# Patient Record
Sex: Female | Born: 1960 | Race: Black or African American | Hispanic: No | Marital: Married | Smoking: Never smoker
Health system: Southern US, Community
[De-identification: ages and names within clinical notes are randomized; demographics above are authoritative.]

## PROBLEM LIST (undated history)

## (undated) DIAGNOSIS — N3281 Overactive bladder: Secondary | ICD-10-CM

## (undated) DIAGNOSIS — E663 Overweight: Secondary | ICD-10-CM

## (undated) DIAGNOSIS — E119 Type 2 diabetes mellitus without complications: Secondary | ICD-10-CM

## (undated) HISTORY — DX: Type 2 diabetes mellitus without complications: E11.9

## (undated) HISTORY — PX: MOUTH SURGERY: SHX715

## (undated) HISTORY — DX: Overweight: E66.3

## (undated) HISTORY — PX: ROTATOR CUFF REPAIR: SHX139

## (undated) HISTORY — DX: Overactive bladder: N32.81

---

## 1997-11-25 ENCOUNTER — Inpatient Hospital Stay (HOSPITAL_COMMUNITY): Admission: AD | Admit: 1997-11-25 | Discharge: 1997-11-25 | Payer: Self-pay | Admitting: Obstetrics and Gynecology

## 1997-12-02 ENCOUNTER — Encounter (HOSPITAL_COMMUNITY): Admission: RE | Admit: 1997-12-02 | Discharge: 1997-12-26 | Payer: Self-pay | Admitting: Obstetrics and Gynecology

## 1997-12-23 ENCOUNTER — Inpatient Hospital Stay (HOSPITAL_COMMUNITY): Admission: AD | Admit: 1997-12-23 | Discharge: 1997-12-26 | Payer: Self-pay | Admitting: Gynecology

## 1998-02-03 ENCOUNTER — Other Ambulatory Visit: Admission: RE | Admit: 1998-02-03 | Discharge: 1998-02-03 | Payer: Self-pay | Admitting: Obstetrics and Gynecology

## 1998-12-21 ENCOUNTER — Emergency Department (HOSPITAL_COMMUNITY): Admission: EM | Admit: 1998-12-21 | Discharge: 1998-12-21 | Payer: Self-pay | Admitting: Emergency Medicine

## 1999-09-17 HISTORY — PX: TUBAL LIGATION: SHX77

## 2000-01-23 ENCOUNTER — Other Ambulatory Visit: Admission: RE | Admit: 2000-01-23 | Discharge: 2000-01-23 | Payer: Self-pay | Admitting: Gynecology

## 2000-06-03 ENCOUNTER — Inpatient Hospital Stay (HOSPITAL_COMMUNITY): Admission: AD | Admit: 2000-06-03 | Discharge: 2000-06-03 | Payer: Self-pay | Admitting: Gynecology

## 2000-07-31 ENCOUNTER — Inpatient Hospital Stay (HOSPITAL_COMMUNITY): Admission: AD | Admit: 2000-07-31 | Discharge: 2000-08-03 | Payer: Self-pay | Admitting: Gynecology

## 2000-07-31 ENCOUNTER — Encounter (INDEPENDENT_AMBULATORY_CARE_PROVIDER_SITE_OTHER): Payer: Self-pay | Admitting: Specialist

## 2000-09-23 ENCOUNTER — Other Ambulatory Visit: Admission: RE | Admit: 2000-09-23 | Discharge: 2000-09-23 | Payer: Self-pay | Admitting: Gynecology

## 2002-06-03 ENCOUNTER — Other Ambulatory Visit: Admission: RE | Admit: 2002-06-03 | Discharge: 2002-06-03 | Payer: Self-pay | Admitting: Gynecology

## 2002-07-15 ENCOUNTER — Encounter: Admission: RE | Admit: 2002-07-15 | Discharge: 2002-10-13 | Payer: Self-pay | Admitting: Gynecology

## 2004-05-14 ENCOUNTER — Other Ambulatory Visit: Admission: RE | Admit: 2004-05-14 | Discharge: 2004-05-14 | Payer: Self-pay | Admitting: Gynecology

## 2004-08-02 ENCOUNTER — Encounter: Admission: RE | Admit: 2004-08-02 | Discharge: 2004-08-02 | Payer: Self-pay | Admitting: Family Medicine

## 2005-02-25 ENCOUNTER — Encounter: Admission: RE | Admit: 2005-02-25 | Discharge: 2005-02-25 | Payer: Self-pay | Admitting: Family Medicine

## 2005-12-31 ENCOUNTER — Other Ambulatory Visit: Admission: RE | Admit: 2005-12-31 | Discharge: 2005-12-31 | Payer: Self-pay | Admitting: Gynecology

## 2007-12-18 ENCOUNTER — Other Ambulatory Visit: Admission: RE | Admit: 2007-12-18 | Discharge: 2007-12-18 | Payer: Self-pay | Admitting: Gynecology

## 2009-08-30 ENCOUNTER — Other Ambulatory Visit: Admission: RE | Admit: 2009-08-30 | Discharge: 2009-08-30 | Payer: Self-pay | Admitting: Gynecology

## 2009-08-30 ENCOUNTER — Ambulatory Visit: Payer: Self-pay | Admitting: Gynecology

## 2010-02-22 ENCOUNTER — Ambulatory Visit: Payer: Self-pay | Admitting: Gynecology

## 2011-02-01 NOTE — H&P (Signed)
[facility]  Patient:    Becky Decker, Becky Decker                    MRN: [id number] Adm. Date:  07/31/00 Attending:  Gaetano Hawthorne. Lily Peer, M.D.                         History and Physical  CHIEF COMPLAINT:              1. Suspected fetal macrosomia.                               2. Previous cesarean section, requesting                                  elective repeat.                               3. Request for elective permanent sterilization.  HISTORY:                      The patient is a 50 year old gravida 2, para 1, who was seen in the office at her last prenatal visit on November 8th, with a followup ultrasound due to the fact that during her pregnancy and previous ultrasounds that indicated that this developing fetus was in the 97th percentile.  She did have a cesarean section in April of 1999 at 40 weeks, an 8-pound 3-ounce baby, secondary to failure to progress after two hours of labor.  Now, at [redacted] weeks gestation, this developing fetus is greater than the 97th percentile and weighs more than her previous baby; this baby weighs currently 8 pounds 10 ounces, with normal amniotic fluid index in the 58th percentile.  Head circumference was equivalent to 40 weeks size.  With these findings, patient and her husband decided not to go through a trial of labor but to go with a repeat cesarean section; they had also requested for an elective bilateral tubal sterilization procedure.  The risks, benefits, pros and cons of these were discussed with the patient in detail in the office. Due to the fact that her father has a strong history of diabetes, a hemoglobin A1c was done earlier in her pregnancy which was found to be normal, and her diabetes screen was elevated at 158; subsequently followed with a three-hour GTT which was normal.  Also, she was tested for sickle cell, which was negative, but her daughter is a sickle cell carrier.  Her husband was  tested and he is a carrier.  We had obtained fluid at the time of the amniocentesis to be submitted also for testing for sickle cell but the laboratory had problems and this was not tested and the family decided not to offer a repeat amniocentesis and just to test the newborn for carrier status.  She had a urinary tract infection during her pregnancy and has had some bouts of nausea also and she does have a fibroid uterus; otherwise, she has done well.  PHYSICAL EXAMINATION  VITAL SIGNS:                  Blood pressure 110/72.  Weight was 228 pounds. Urine had 1+ protein and a urine culture is pending at time of this dictation.  HEENT:  Unremarkable.  NECK:                         Supple.  Trachea midline.  No carotid bruits. No thyromegaly.  LUNGS:                        Clear to auscultation without rhonchi or wheezes.  HEART:                        Regular rate and rhythm.  No murmurs or gallops.  BREASTS:                      Examination was done during the first trimester and reported to be normal.  ABDOMEN:                      Gravid uterus with a fundal height approximately 42.5 cm.  Positive fetal heart tones.  PELVIC:                       Cervix was long, closed and posterior.  EXTREMITIES:                  DTRs 1+.  Negative clonus.  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  Her VDRL, hepatitis B surface antigen and HIV were negative.  Rubella titer with evidence of immunity.  Her alpha-fetoprotein was normal.  Diabetes screen:  As described above.  GBS culture was negative and her Pap smear was also normal.  ASSESSMENT:                   [age]-year-old gravida 2, para 1 will be 39 weeks at time of her planned repeat cesarean section, with suspected fetal macrosomia and previous history of failure to progress during  the second stage of labor at 40 weeks and was carrying an 8-pound 3-ounce baby at that time. She is only 38 weeks now.  This baby is in the 97th percentile, with the head circumference equivalent to [redacted] weeks gestation and weighing now 8 pounds 10 ounces.  Patient has elected to proceed with an elective repeat cesarean section and also for elective permanent sterilization.  Risks, benefits, pros and cons and failure rates of sterilization procedure were discussed as well. She did receive RhoGAM at the time of her amniocentesis and at 28 weeks and she was found to have anti-Lewis antibodies, which was discussed with her that this does not cause erythroblastosis, and will proceed then with cesarean section.  PLAN:                         Patient is scheduled for repeat lower uterine segment transverse cesarean section along with bilateral tubal sterilization procedure on Thursday, November 15th, at 7:30 a.m. at [facility]; please have history and physical available. DD:  07/25/00 TD:  07/25/00 Job: [id number]

## 2011-02-01 NOTE — Op Note (Signed)
[facility]  Patient:    Becky Decker, Becky Decker                    MRN: [id number] Adm. Date:  [id number] Attending:  Tonye Royalty                           Operative Report  PREOPERATIVE DIAGNOSES:       1. Term intrauterine pregnancy at 52 weeks                                  estimated gestational age.                               2. Suspected fetal macrosomia.                               3. Requests repeat cesarean section.                               4. Requests elective permanent sterilization.  POSTOPERATIVE DIAGNOSES:      1. Term intrauterine pregnancy at 52 weeks                                  estimated gestational age.                               2. Suspected fetal macrosomia.                               3. Requests repeat cesarean section.                               4. Requests elective permanent sterilization.  PROCEDURE PERFORMED:          Repeat lower uterine segment transverse cesarean section and bilateral tubal sterilization procedure, Pomeroy technique.  SURGEON:                      Juan H. Lily Peer, M.D.  FIRST ASSISTANT:              Timothy P. Fontaine, M.D.  ANESTHESIA:                   Spinal.  INDICATIONS:                  A 50 year old gravida 2, para 1 with previous cesarean section secondary to failure to progress in labor and an LGA baby. Suspected macrosomia this pregnancy.  The patient has elected for repeat cesarean section as well as elective permanent sterilization.  FINDINGS:                     Viable female infant with Apgars of 9 and 9 with a weight of 8 pounds and 8 ounces.  Clear amniotic fluid.  Normal maternal pelvic anatomy.  Nuchal cord x 1.  DESCRIPTION OF PROCEDURE:     After the patient was adequately  ______ , she underwent successful spinal placement.  She was prepped and draped in the usual sterile fashion.  A Pfannenstiel skin incision was made adjacent to the previous  Pfannenstiel scar.  A Foley catheter prior to this had been inserted and left to monitor urinary output.  The incision was carried down through the skin and subcutaneous tissue to the rectus fascia, whereby a midline nick was made.  The fascia was incised in a transverse fashion for additional exposure. A small portion of the abdominis rectus muscle was incised to allow adequate room due to a significant amount of scarring from previous cesarean section, limiting somewhat the exposure.  After this was accomplished and the peritoneum was entered cautiously, a bladder flap was established.  The lower uterine segment was incised in a transverse fashion.  Clear amniotic fluid was present.  The newborns head was delivered.  The nasopharyngeal area was well suctioned.  The nuchal cord was manually reduced and the newborn was delivered.  The nasopharyngeal area was bulb suctioned.  He gave immediate cry.  The cord was double clamped and excised.  The infant was passed off of operative field after being shown to the patient.  It was passed off to the pediatricians who were in attendance and gave the above mentioned parameters. After cord blood was obtained, the placenta was delivered from the intrauterine cavity and submitted for histologic evaluation.  The uterus was then exteriorized.  The interior uterine cavity was sucked clear of remaining products of conception.  A Pitocin drop was started.  The patient did receive 1 g of Cefotan due to the fact that she was being treated at the same time for a urinary tract infection.  The lower uterine segment was closed with a single layer closure with 0 Vicryl suture in a locking stitch manner.  After this, attention was paid to the proximal 1/3 portion of the left fallopian tube.  A 2 cm segment was suture ligated with 3-0 Vicryl suture x 2 and the 2 cm segment was excised and passed off of the operative field.  The remaining stumps were Bovie cauterized.   A similar procedure was carried out on the contralateral side.  The uterus was placed back into the abdominal cavity. After copious irrigation of the pelvic cavity and ascertaining adequate hemostasis, the fascia was closed with a running stitch of 0 Vicryl suture. Of note, the visceral peritoneum was now approximated.  The subcutaneous bleeders were Bovie cauterized.  The skin was reapproximated with skin clips, followed by placement of Xeroform gauze and 4 x 4 dressing.  The patient was transferred to the recovery room with stable vital signs.  Blood loss for the procedure was 600 cc.  Urine output was 150 cc and clear.  IV fluids were 3500 cc of lactated Ringers. DD:  07/31/00 TD:  07/31/00 Job: [id number]

## 2011-02-01 NOTE — Discharge Summary (Signed)
[facility]  Patient:    Becky Decker, Becky Decker                    MRN: [id number] Adm. Date:  [id number] Disc. Date: [id number] Attending:  Tonye Royalty Dictator:   Antony Contras, [facility]                           Discharge Summary  DISCHARGE DIAGNOSES:          Intrauterine pregnancy at term, following prenatal risk factors:  History of previous cesarean section, anti ______ A antibody, advanced maternal age, fibroid uterus, husband positive for sickle cell trait, RhoGAM candidate.  PROCEDURE:                    Low cervical transverse cesarean section with tubal sterilization.  HISTORY OF PRESENT ILLNESS:   Patient is a 50 year old gravida 2, para 1-0-0-1 with an EDC of August 07, 2000 per ultrasound.  Patient has the following prenatal risk factors:  History of previous cesarean section, anti ______ A antibody, advanced maternal age, gestational diabetes with her last pregnancy, RhoGAM candidate, fibroid uterus, husband positive for sickle cell trait.  LABORATORIES:                 Blood type O-.  Antibody screen positive. Sickle cell negative.  RPR, HBSAG, HIV nonreactive.  Rubella immune.  GBS negative.  MSAFP normal.  Normal amniocentesis.  HOSPITAL COURSE:              Patient was admitted for elective repeat cesarean section, also fetal macrosomia was suspected.  Also, she wished permanent tubal sterilization.  This was performed by Dr. Lily Peer under spinal anesthesia.  Patient was delivered of an Apgar 9/9 female infant weighing 8 pounds 8 ounces.  Postoperative course was complicated by increased flatus. Otherwise she remained afebrile.  Had no difficulty voiding.  CBC:  Hematocrit 28, hemoglobin 10, WBC 13.7, platelets 211.  Patient did also receive RhoGAM post delivery.  She was able to be discharged on her third postoperative day in satisfactory condition.  DISPOSITION:                  Follow up in six weeks.  Continue with  prenatal vitamins and iron. DD:  08/29/00 TD:  08/29/00 Job: [id number]

## 2011-04-22 ENCOUNTER — Encounter: Payer: Self-pay | Admitting: Anesthesiology

## 2011-04-26 ENCOUNTER — Encounter: Payer: Self-pay | Admitting: Gynecology

## 2011-05-07 ENCOUNTER — Encounter: Payer: Self-pay | Admitting: Gynecology

## 2011-05-14 ENCOUNTER — Encounter: Payer: Self-pay | Admitting: Gynecology

## 2011-05-14 ENCOUNTER — Ambulatory Visit (INDEPENDENT_AMBULATORY_CARE_PROVIDER_SITE_OTHER): Payer: BC Managed Care – PPO | Admitting: Gynecology

## 2011-05-14 ENCOUNTER — Other Ambulatory Visit (HOSPITAL_COMMUNITY)
Admission: RE | Admit: 2011-05-14 | Discharge: 2011-05-14 | Disposition: A | Discharge status: Home / Self Care | Payer: BC Managed Care – PPO | Source: Ambulatory Visit | Attending: Gynecology | Admitting: Gynecology

## 2011-05-14 DIAGNOSIS — Z8632 Personal history of gestational diabetes: Secondary | ICD-10-CM

## 2011-05-14 DIAGNOSIS — R823 Hemoglobinuria: Secondary | ICD-10-CM

## 2011-05-14 DIAGNOSIS — Z01419 Encounter for gynecological examination (general) (routine) without abnormal findings: Secondary | ICD-10-CM

## 2011-05-14 DIAGNOSIS — Z13220 Encounter for screening for lipoid disorders: Secondary | ICD-10-CM

## 2011-05-14 DIAGNOSIS — Z1211 Encounter for screening for malignant neoplasm of colon: Secondary | ICD-10-CM

## 2011-05-14 DIAGNOSIS — R635 Abnormal weight gain: Secondary | ICD-10-CM | POA: Insufficient documentation

## 2011-05-14 DIAGNOSIS — N3941 Urge incontinence: Secondary | ICD-10-CM

## 2011-05-14 LAB — POC HEMOCCULT BLD/STL (OFFICE/1-CARD/DIAGNOSTIC): Fecal Occult Blood, POC: NEGATIVE

## 2011-05-14 NOTE — Progress Notes (Signed)
Becky Decker March 27, 1961 [id number]   History:    50 y.o. gravida 2 para 2 who has not been seen in the office in 2 years review of her records indicated she was weighing 215 pounds up to 251 pounds. She was seen in June of last year with complaints of urgency incontinence had a Q-tip angle tests less than 90 and on Valsalva maneuver in the supine and erect position she denied leaking urine and it was recommended she followup with the urodynamic evaluation which she has not done so. She states immediately after she drinks something she has urinary frequency and she does have nocturia whereby she gets up at at least 5 times a night to urinate regardless which she drinks during the day. She is fasting today so we'll be overdue her fasting lipid profile today and screen her for diabetes as well. Back in June 2011 her LDL was elevated 173. She frequently does result has examination but her last mammogram was several years ago. She has not had a screening colonoscopy or baseline bone density study yet. In June of 2011 she was noted to have an elevated FSH indicative of her being menopausal state. Patient's not complaining of any vasomotor symptoms.  Past medical history,surgical history, family history and social history were all reviewed and documented in the EPIC chart. ROS:  Was performed and pertinent positives and negatives are included in the history.  Exam: chaperone present Filed Vitals:   05/14/11 1402  BP: 132/94   @WEIGHT @ Body mass index is 45.54 kg/(m^2).  General appearance : Well developed well nourished female. Skin grossly normal/anterior left chest wall lipoma HEENT: Neck supple, trachea midline Lungs: Clear to auscultation, no rhonchi or wheezes Heart: Regular rate and rhythm, no murmurs or gallops Breast:Examined in sitting and supine position were symmetrical in appearance, no palpable masses, to skin retraction, no nipple inversion, no nipple discharge and no axillary or  supraclavicular lymphadenopathy Abdomen: no palpable masses or tenderness Pelvic  Ext/BUS/vagina  normal   Cervix  normal   Uterus  anteverted, normal size, shape and contour, midline and mobile nontender   Adnexa  Without masses or tenderness  Anus and perineum  normal   Rectovaginal  normal sphincter tone without palpated masses or tenderness             Hemoccult obtained results pending at time of this dictation     Assessment/Plan:  50 y.o. female for annual exam with complaints of her urgency incontinence. Patient is morbidly obese. History of elevated LDL last year. Patient currently in fasting state for this reason we'll check a fasting blood sugar along with a fasting lipid profile CBC urinalysis and Pap smear and a TSH as a result of her weight gain. Patient will be referred to the gastroenterologists for screening colonoscopy. Fecal call blood testing was done today results pending at time of this dictation patient with schedule urodynamic evaluation the office within the next several weeks. She will also need to schedule her mammogram as well. We discussed importance of appropriate nutrition exercise as well as intake of calcium and vitamin D for osteoporosis prevention.    Ok Edwards MD, 2:44 PM 05/14/2011

## 2011-05-15 ENCOUNTER — Telehealth: Payer: Self-pay | Admitting: *Deleted

## 2011-05-15 DIAGNOSIS — R7301 Impaired fasting glucose: Secondary | ICD-10-CM

## 2011-05-15 NOTE — Telephone Encounter (Signed)
I CALLED PTS HUSBAND TO GIVE INSURANCE BENEFITS FOR URODYNAMICS AND TO SET UP APPT. IT IS SCHEDULED FOR 05/29/11 AT 9AM. INSTRUCTION SHEET MAILED. KW

## 2011-05-15 NOTE — Telephone Encounter (Signed)
Pt informed with the below note, order in computer for internal medicine doctor.

## 2011-05-15 NOTE — Telephone Encounter (Signed)
Message copied by Aura Camps on Wed May 15, 2011  8:36 AM ------      Message from: Ok Edwards      Created: Tue May 14, 2011  3:52 PM       Victorino Dike, please call patient's and tell her that her blood sugar was very high indicative of possibly being diabetic. This would explain her urinary frequency. Fasting blood sugar should be less than 126 and her values today was 242. Her total cholesterol was elevated to 10 and her LDL was elevated at 132 as well her HDL and triglyceride was normal. Patellar we'll need to counsel her urodynamic evaluation. It seems her symptoms or urinary frequency is attributed to her underlying diabetes that has just been diagnosed. She will need to be on tighter control and placed on appropriate medication. I would like to refer her to Dr. Margaretmary Bayley was internal medicine specialist for further evaluation and treatment. Please check with Dr. Ophelia Charter office to see if there on electronic medical records with epic so they can obtain my office note. If not we'll need to for them a copy of my last office note.

## 2011-05-29 ENCOUNTER — Ambulatory Visit (INDEPENDENT_AMBULATORY_CARE_PROVIDER_SITE_OTHER): Payer: BC Managed Care – PPO | Admitting: *Deleted

## 2011-05-29 DIAGNOSIS — N393 Stress incontinence (female) (male): Secondary | ICD-10-CM

## 2011-05-29 DIAGNOSIS — N3941 Urge incontinence: Secondary | ICD-10-CM

## 2011-06-06 ENCOUNTER — Other Ambulatory Visit: Payer: Self-pay | Admitting: *Deleted

## 2011-06-06 DIAGNOSIS — R32 Unspecified urinary incontinence: Secondary | ICD-10-CM

## 2011-06-06 DIAGNOSIS — N393 Stress incontinence (female) (male): Secondary | ICD-10-CM

## 2011-06-06 DIAGNOSIS — N3941 Urge incontinence: Secondary | ICD-10-CM

## 2011-06-11 ENCOUNTER — Encounter: Payer: Self-pay | Admitting: Gynecology

## 2011-06-11 ENCOUNTER — Ambulatory Visit (INDEPENDENT_AMBULATORY_CARE_PROVIDER_SITE_OTHER): Payer: BC Managed Care – PPO | Admitting: Gynecology

## 2011-06-11 VITALS — BP 128/76

## 2011-06-11 DIAGNOSIS — IMO0001 Reserved for inherently not codable concepts without codable children: Secondary | ICD-10-CM

## 2011-06-11 DIAGNOSIS — E669 Obesity, unspecified: Secondary | ICD-10-CM

## 2011-06-11 DIAGNOSIS — R32 Unspecified urinary incontinence: Secondary | ICD-10-CM

## 2011-06-11 DIAGNOSIS — E119 Type 2 diabetes mellitus without complications: Secondary | ICD-10-CM

## 2011-06-11 DIAGNOSIS — R350 Frequency of micturition: Secondary | ICD-10-CM

## 2011-06-11 MED ORDER — TOLTERODINE TARTRATE ER 4 MG PO CP24: 4.0000 mg | ORAL_CAPSULE | Freq: Every day | ORAL | Status: DC

## 2011-06-11 NOTE — Progress Notes (Signed)
Patient presented to the office today for consultation due to the fact she underwent a urodynamic evaluation in the office on 05/29/2011 as a result of her complaints of urinary frequency and urge incontinence. Prior to that lab work have been drawn at time of her annual exam and her fasting blood sugar was found to be elevated at 242. Her CBC was normal her total cholesterol was slightly elevated to 10, HDL normal at 59, triglycerides normal at 97, LDL was slightly elevated at 132, and her cholesterol/HDL ratio was normal at 3.6. Her Pap smear was normal as well. Colonoscopy and bone density study has not been done yet. Patient is postmenopausal on no hormone replacement therapy.  Patient was referred to the internist and Dr. Margaretmary Bayley who had seen her and start her treating her for the newly diagnosed type 2 diabetes and has placed her on an oral hypoglycemic agent. (Patient does not recall the name).  Urodynamic evaluation: Complex uroflowmetry was normal and demonstrated no evidence of obstructive uropathy. Her post void residual was normal at 0 cc. 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 The patient's cystometric bladder capacity was limited (less than 4 cc). The patient sensory vines were higher than expected. Maximum urethral closure pressure (greater than 30 cm of water) were within a normal range and failed to demonstrate intrinsic sphincter deficiency. 3 channel CMG with flow was within normal limits given the testing situation. (Peak flow 20-30 mL/second with PVR of 25% or less of voided volume)  Patient with apparent next incontinence (urgent stress). Also would newly diagnosed diabetes. Patient with low bladder capacity and  high sensitivity. Patient also with low leak point pressures. Although her Q-tip test was less than 30 there was no evidence of cystocele or rectocele the case had been discussed the Dr. Patsi Sears urologist for further consideration. He had recommended passes patient may be a candidate for physical therapy and for possible Botox. I had a lengthy discussion with the patient today and although the CMG did not demonstrate detrusor dyssynergia but based on her history of frequency and nocturia she would like to give a try an anticholinergic agent for which will tried Detrol LA 4 mg daily. The risks benefits and pros and cons were discussed. Patient has no history of glaucoma. She will return back to the office in 4-6 months to reassess. If her symptoms worsen she states that she would go see the urologist for further evaluation and intervention.

## 2011-06-12 ENCOUNTER — Encounter: Payer: Self-pay | Admitting: *Deleted

## 2012-05-27 ENCOUNTER — Other Ambulatory Visit: Payer: Self-pay | Admitting: Gynecology

## 2012-05-27 DIAGNOSIS — Z1231 Encounter for screening mammogram for malignant neoplasm of breast: Secondary | ICD-10-CM

## 2012-06-05 ENCOUNTER — Ambulatory Visit (INDEPENDENT_AMBULATORY_CARE_PROVIDER_SITE_OTHER): Payer: BC Managed Care – PPO | Admitting: Gynecology

## 2012-06-05 ENCOUNTER — Ambulatory Visit
Admission: RE | Admit: 2012-06-05 | Discharge: 2012-06-05 | Disposition: A | Discharge status: Home / Self Care | Payer: BC Managed Care – PPO | Source: Ambulatory Visit | Attending: Gynecology | Admitting: Gynecology

## 2012-06-05 ENCOUNTER — Encounter: Payer: Self-pay | Admitting: Gynecology

## 2012-06-05 VITALS — BP 128/80 | Ht 63.0 in | Wt 244.0 lb

## 2012-06-05 DIAGNOSIS — Z01419 Encounter for gynecological examination (general) (routine) without abnormal findings: Secondary | ICD-10-CM

## 2012-06-05 DIAGNOSIS — IMO0001 Reserved for inherently not codable concepts without codable children: Secondary | ICD-10-CM

## 2012-06-05 DIAGNOSIS — Z23 Encounter for immunization: Secondary | ICD-10-CM

## 2012-06-05 DIAGNOSIS — Z1231 Encounter for screening mammogram for malignant neoplasm of breast: Secondary | ICD-10-CM

## 2012-06-05 DIAGNOSIS — E663 Overweight: Secondary | ICD-10-CM

## 2012-06-05 DIAGNOSIS — N3281 Overactive bladder: Secondary | ICD-10-CM

## 2012-06-05 DIAGNOSIS — R350 Frequency of micturition: Secondary | ICD-10-CM

## 2012-06-05 DIAGNOSIS — N318 Other neuromuscular dysfunction of bladder: Secondary | ICD-10-CM

## 2012-06-05 DIAGNOSIS — E119 Type 2 diabetes mellitus without complications: Secondary | ICD-10-CM | POA: Insufficient documentation

## 2012-06-05 LAB — CBC WITH DIFFERENTIAL/PLATELET
Basophils Absolute: 0 10*3/uL (ref 0.0–0.1)
Basophils Relative: 0 % (ref 0–1)
Eosinophils Absolute: 0.1 10*3/uL (ref 0.0–0.7)
Eosinophils Relative: 2 % (ref 0–5)
HCT: 45.5 % (ref 36.0–46.0)
Hemoglobin: 15.1 g/dL — ABNORMAL HIGH (ref 12.0–15.0)
Lymphocytes Relative: 27 % (ref 12–46)
Lymphs Abs: 1.6 10*3/uL (ref 0.7–4.0)
MCH: 29.9 pg (ref 26.0–34.0)
MCHC: 33.2 g/dL (ref 30.0–36.0)
MCV: 90.1 fL (ref 78.0–100.0)
Monocytes Absolute: 0.3 10*3/uL (ref 0.1–1.0)
Monocytes Relative: 4 % (ref 3–12)
Neutro Abs: 4 10*3/uL (ref 1.7–7.7)
Neutrophils Relative %: 67 % (ref 43–77)
Platelets: 247 10*3/uL (ref 150–400)
RBC: 5.05 MIL/uL (ref 3.87–5.11)
RDW: 12.9 % (ref 11.5–15.5)
WBC: 6 10*3/uL (ref 4.0–10.5)

## 2012-06-05 LAB — COMPREHENSIVE METABOLIC PANEL
ALT: 33 U/L (ref 0–35)
AST: 19 U/L (ref 0–37)
Albumin: 4.3 g/dL (ref 3.5–5.2)
Alkaline Phosphatase: 122 U/L — ABNORMAL HIGH (ref 39–117)
BUN: 12 mg/dL (ref 6–23)
CO2: 25 mEq/L (ref 19–32)
Calcium: 9.3 mg/dL (ref 8.4–10.5)
Chloride: 101 mEq/L (ref 96–112)
Creat: 0.71 mg/dL (ref 0.50–1.10)
Glucose, Bld: 285 mg/dL — ABNORMAL HIGH (ref 70–99)
Potassium: 4 mEq/L (ref 3.5–5.3)
Sodium: 136 mEq/L (ref 135–145)
Total Bilirubin: 0.8 mg/dL (ref 0.3–1.2)
Total Protein: 7.6 g/dL (ref 6.0–8.3)

## 2012-06-05 LAB — LIPID PANEL
Cholesterol: 244 mg/dL — ABNORMAL HIGH (ref 0–200)
HDL: 55 mg/dL (ref >39)
LDL Cholesterol: 167 mg/dL — ABNORMAL HIGH (ref 0–99)
Total CHOL/HDL Ratio: 4.4 Ratio
Triglycerides: 108 mg/dL (ref <150)
VLDL: 22 mg/dL (ref 0–40)

## 2012-06-05 LAB — TSH: TSH: 0.704 u[IU]/mL (ref 0.350–4.500)

## 2012-06-05 LAB — HEMOGLOBIN A1C
Hgb A1c MFr Bld: 13.8 % — ABNORMAL HIGH (ref <5.7)
Mean Plasma Glucose: 349 mg/dL — ABNORMAL HIGH (ref <117)

## 2012-06-05 MED ORDER — TOLTERODINE TARTRATE ER 4 MG PO CP24: 4.0000 mg | ORAL_CAPSULE | Freq: Every day | ORAL | Status: DC

## 2012-06-05 NOTE — Progress Notes (Signed)
Becky Decker February 23, 1961 [id number]   History:    51 y.o.  for annual gyn exam who was seen last year and was diagnosed with diabetes and was referred to Dr. Margaretmary Bayley. She was also noted to have borderline hypercholesterolemia. Dr. Chestine Spore had placed her on oral hypoglycemic agents (see medication list) but patient has been poor compliant and stopped  the medication last August. She is overweight but lost 8 pounds since last year. She has been adamant and has not had a colonoscopy yet. She was diagnosed as being in the menopause in 2011 with elevated FSH. She has had no vasomotor symptoms and is currently on no hormone replacement therapy. No prior bone density study. Her Pap smear was normal in 2012. Last year her Hemoccult test was negative. Last year her TSH level was normal. Patient had complained of urinary frequency and mild incontinence and had a urodynamic evaluation which demonstrated that she had low bladder capacity along with increased sensitivity and was to be followed by the urologist and she did not. Her urethral Q-tip angle was less than 30. It appears that her urinary frequency has been attributed to her increase fluid intake as well as her diabetes. She had been started on Detrol LA 4 mg daily last year which helped with her urinary frequency. Patient several years ago had a tubal ligation. Her last mammogram was in 2010 which was normal. She had one done this morning result pending.  Past medical history,surgical history, family history and social history were all reviewed and documented in the EPIC chart.  Gynecologic History Patient's last menstrual period was 03/13/2012. Contraception: tubal ligation Last Pap: 2012. Results were: normal Last mammogram: 2013. Results were: Results pending  Obstetric History OB History    Grav Para Term Preterm Abortions TAB SAB Ect Mult Living   2 2 2       2      # Outc Date GA Lbr Len/2nd Wgt Sex Del Anes PTL Lv   1 TRM     F CS  No  Yes   2 TRM     M CS  No Yes       ROS: A ROS was performed and pertinent positives and negatives are included in the history.  GENERAL: No fevers or chills. HEENT: No change in vision, no earache, sore throat or sinus congestion. NECK: No pain or stiffness. CARDIOVASCULAR: No chest pain or pressure. No palpitations. PULMONARY: No shortness of breath, cough or wheeze. GASTROINTESTINAL: No abdominal pain, nausea, vomiting or diarrhea, melena or bright red blood per rectum. GENITOURINARY: No urinary frequency, urgency, hesitancy or dysuria. MUSCULOSKELETAL: No joint or muscle pain, no back pain, no recent trauma. DERMATOLOGIC: No rash, no itching, no lesions. ENDOCRINE: No polyuria, polydipsia, no heat or cold intolerance. No recent change in weight. HEMATOLOGICAL: No anemia or easy bruising or bleeding. NEUROLOGIC: No headache, seizures, numbness, tingling or weakness. PSYCHIATRIC: No depression, no loss of interest in normal activity or change in sleep pattern.     Exam: chaperone present  BP 128/80  Ht 5\' 3"  (1.6 m)  Wt 244 lb (110.678 kg)  BMI 43.22 kg/m2  LMP 03/13/2012  Body mass index is 43.22 kg/(m^2).  General appearance : Well developed well nourished female. No acute distress HEENT: Neck supple, trachea midline, no carotid bruits, no thyroidmegaly Lungs: Clear to auscultation, no rhonchi or wheezes, or rib retractions  Heart: Regular rate and rhythm, no murmurs or gallops Breast:Examined in sitting and supine position were  symmetrical in appearance, no palpable masses or tenderness,  no skin retraction, no nipple inversion, no nipple discharge, no skin discoloration, no axillary or supraclavicular lymphadenopathy Abdomen: no palpable masses or tenderness, no rebound or guarding Extremities: no edema or skin discoloration or tenderness  Pelvic:  Bartholin, Urethra, Skene Glands: Within normal limits             Vagina: No gross lesions or discharge  Cervix: No gross lesions or  discharge  Uterus  anteverted, normal size, shape and consistency, non-tender and mobile  Adnexa  Without masses or tenderness  Anus and perineum  normal   Rectovaginal  normal sphincter tone without palpated masses or tenderness             Hemoccult cards will be provided     Assessment/Plan:  51 y.o. female for annual exam overweight with type 2 diabetes poorly compliant. She will have the following fasting blood were: Hemoglobin A1c, TSH, lipid profile, CBC, and urinalysis. We discussed the new Pap smear screening guidelines. No Pap smear done today. It was stressed upon her to followup with Dr. Margaretmary Bayley next week and we'll for him a copy of this office note and labs. Her mammogram was done today results pending at time of this dictation. She was given the name of gastroenterologist for her to schedule colonoscopy. We should do a bone density study later this year or next year. She was encouraged to do her monthly self breast examination. We discussed importance of exercise and diet. She was given samples as well as a prescription of Detrol LA 4 mg to take 1 by mouth daily for her urinary frequency and nocturia. Once her blood sugars are under control after 6 months she may want to discontinue the Detrol. Patient declined flu vaccine today.    Ok Edwards MD, 11:12 AM 06/05/2012

## 2012-06-05 NOTE — Patient Instructions (Signed)
Diabetes, Type 2 Diabetes is a long-lasting (chronic) disease. In type 2 diabetes, the pancreas does not make enough insulin (a hormone), and the body does not respond normally to the insulin that is made. This type of diabetes was also previously called adult-onset diabetes. It usually occurs after the age of 80, but it can occur at any age.  CAUSES  Type 2 diabetes happens because the pancreasis not making enough insulin or your body has trouble using the insulin that your pancreas does make properly. SYMPTOMS   Drinking more than usual.   Urinating more than usual.   Blurred vision.   Dry, itchy skin.   Frequent infections.   Feeling more tired than usual (fatigue).  DIAGNOSIS The diagnosis of type 2 diabetes is usually made by one of the following tests:  Fasting blood glucose test. You will not eat for at least 8 hours and then take a blood test.   Random blood glucose test. Your blood glucose (sugar) is checked at any time of the day regardless of when you ate.   Oral glucose tolerance test (OGTT). Your blood glucose is measured after you have not eaten (fasted) and then after you drink a glucose containing beverage.  TREATMENT   Healthy eating.   Exercise.   Medicine, if needed.   Monitoring blood glucose.   Seeing your caregiver regularly.  HOME CARE INSTRUCTIONS   Check your blood glucose at least once a day. More frequent monitoring may be necessary, depending on your medicines and on how well your diabetes is controlled. Your caregiver will advise you.   Take your medicine as directed by your caregiver.   Do not smoke.   Make wise food choices. Ask your caregiver for information. Weight loss can improve your diabetes.   Learn about low blood glucose (hypoglycemia) and how to treat it.   Get your eyes checked regularly.   Have a yearly physical exam. Have your blood pressure checked and your blood and urine tested.   Wear a pendant or bracelet saying  that you have diabetes.   Check your feet every night for cuts, sores, blisters, and redness. Let your caregiver know if you have any problems.  SEEK MEDICAL CARE IF:   You have problems keeping your blood glucose in target range.   You have problems with your medicines.   You have symptoms of an illness that do not improve after 24 hours.   You have a sore or wound that is not healing.   You notice a change in vision or a new problem with your vision.   You have a fever.  MAKE SURE YOU:  Understand these instructions.   Will watch your condition.   Will get help right away if you are not doing well or get worse.  Document Released: 09/02/2005 Document Revised: 08/22/2011 Document Reviewed: 02/18/2011 [facility] Patient Information 2012 [place], [place].  Diabetes, Frequently Asked Questions WHAT IS DIABETES? Most of the food we eat is turned into glucose (sugar). Our bodies use it for energy. The pancreas makes a hormone called insulin. It helps glucose get into the cells of our bodies. When you have diabetes, your body either does not make enough insulin or cannot use its own insulin as well as it should. This causes sugars to build up in your blood. WHAT ARE THE SYMPTOMS OF DIABETES?  Frequent urination.   Excessive thirst.   Unexplained weight loss.   Extreme hunger.   Blurred vision.   Tingling or  numbness in hands or feet.   Feeling very tired much of the time.   Dry, itchy skin.   Sores that are slow to heal.   Yeast infections.  WHAT ARE THE TYPES OF DIABETES? Type 1 Diabetes   About 10% of affected people have this type.   Usually occurs before the age of 35.   Usually occurs in thin to normal weight people.  Type 2 Diabetes  About 90% of affected people have this type.   Usually occurs after the age of 64.   Usually occurs in overweight people.   More likely to have:   A family history of diabetes.   A history of diabetes during pregnancy  (gestational diabetes).   High blood pressure.   High cholesterol and triglycerides.  Gestational Diabetes  Occurs in about 4% of pregnancies.   Usually goes away after the baby is born.   More likely to occur in women with:   Family history of diabetes.   Previous gestational diabetes.   Obese.   Over 78 years old.  WHAT IS PRE-DIABETES? Pre-diabetes means your blood glucose is higher than normal, but lower than the diabetes range. It also means you are at risk of getting type 2 diabetes and heart disease. If you are told you have pre-diabetes, have your blood glucose checked again in 1 to 2 years. WHAT IS THE TREATMENT FOR DIABETES? Treatment is aimed at keeping blood glucose near normal levels at all times. Learning how to manage this yourself is important in treating diabetes. Depending on the type of diabetes you have, your treatment will include one or more of the following:  Monitoring your blood glucose.   Meal planning.   Exercise.   Oral medicine (pills) or insulin.  CAN DIABETES BE PREVENTED? With type 1 diabetes, prevention is more difficult, because the triggers that cause it are not yet known. With type 2 diabetes, prevention is more likely, with lifestyle changes:  Maintain a healthy weight.   Eat healthy.   Exercise.  IS THERE A CURE FOR DIABETES? No, there is no cure for diabetes. There is a lot of research going on that is looking for a cure, and progress is being made. Diabetes can be treated and controlled. People with diabetes can manage their diabetes and lead normal, active lives. SHOULD I BE TESTED FOR DIABETES? If you are at least 53 years old, you should be tested for diabetes. You should be tested again every 3 years. If you are 45 or older and overweight, you may want to get tested more often. If you are younger than 45, overweight, and have one or more of the following risk factors, you should be tested:  Family history of diabetes.    Inactive lifestyle.   High blood pressure.  WHAT ARE SOME OTHER SOURCES FOR INFORMATION ON DIABETES? The following organizations may help in your search for more information on diabetes: National Diabetes Education Program (NDEP) Internet: SolarDiscussions.es American Diabetes Association Internet: http://www.diabetes.org  Juvenile Diabetes Foundation International Internet: WetlessWash.is Document Released: 09/05/2003 Document Revised: 08/22/2011 Document Reviewed: 06/30/2009 [facility] Patient Information 2012 [place], [place].  Diabetes Meal Planning Guide The diabetes meal planning guide is a tool to help you plan your meals and snacks. It is important for people with diabetes to manage their blood glucose (sugar) levels. Choosing the right foods and the right amounts throughout your day will help control your blood glucose. Eating right can even help you improve your blood pressure and reach  or maintain a healthy weight. CARBOHYDRATE COUNTING MADE EASY When you eat carbohydrates, they turn to sugar. This raises your blood glucose level. Counting carbohydrates can help you control this level so you feel better. When you plan your meals by counting carbohydrates, you can have more flexibility in what you eat and balance your medicine with your food intake. Carbohydrate counting simply means adding up the total amount of carbohydrate grams in your meals and snacks. Try to eat about the same amount at each meal. Foods with carbohydrates are listed below. Each portion below is 1 carbohydrate serving or 15 grams of carbohydrates. Ask your dietician how many grams of carbohydrates you should eat at each meal or snack. Grains and Starches  1 slice bread.    English muffin or hotdog/hamburger bun.    cup cold cereal (unsweetened).   ? cup cooked pasta or rice.    cup starchy vegetables (corn, potatoes, peas, beans, winter squash).   1 tortilla (6 inches).    bagel.    1 waffle or pancake (size of a CD).    cup cooked cereal.   4 to 6 small crackers.  *Whole grain is recommended. Fruit  1 cup fresh unsweetened berries, melon, papaya, pineapple.   1 small fresh fruit.    banana or mango.    cup fruit juice (4 oz unsweetened).    cup canned fruit in natural juice or water.   2 tbs dried fruit.   12 to 15 grapes or cherries.  Milk and Yogurt  1 cup fat-free or 1% milk.   1 cup soy milk.   6 oz light yogurt with sugar-free sweetener.   6 oz low-fat soy yogurt.   6 oz plain yogurt.  Vegetables  1 cup raw or  cup cooked is counted as 0 carbohydrates or a "free" food.   If you eat 3 or more servings at 1 meal, count them as 1 carbohydrate serving.  Other Carbohydrates   oz chips or pretzels.    cup ice cream or frozen yogurt.    cup sherbet or sorbet.   2 inch square cake, no frosting.   1 tbs honey, sugar, jam, jelly, or syrup.   2 small cookies.   3 squares of graham crackers.   3 cups popcorn.   6 crackers.   1 cup broth-based soup.   Count 1 cup casserole or other mixed foods as 2 carbohydrate servings.   Foods with less than 20 calories in a serving may be counted as 0 carbohydrates or a "free" food.  You may want to purchase a book or computer software that lists the carbohydrate gram counts of different foods. In addition, the nutrition facts panel on the labels of the foods you eat are a good source of this information. The label will tell you how big the serving size is and the total number of carbohydrate grams you will be eating per serving. Divide this number by 15 to obtain the number of carbohydrate servings in a portion. Remember, 1 carbohydrate serving equals 15 grams of carbohydrate. SERVING SIZES Measuring foods and serving sizes helps you make sure you are getting the right amount of food. The list below tells how big or small some common serving sizes are.  1 oz.........4 stacked dice.   3  oz........Marland KitchenDeck of cards.   1 tsp.......Marland KitchenTip of little finger.   1 tbs......Marland KitchenMarland KitchenThumb.   2 tbs.......Marland KitchenGolf ball.    cup......Marland KitchenHalf of a fist.   1 cup.......Marland KitchenA fist.  SAMPLE DIABETES MEAL PLAN Below is a sample meal plan that includes foods from the grain and starches, dairy, vegetable, fruit, and meat groups. A dietician can individualize a meal plan to fit your calorie needs and tell you the number of servings needed from each food group. However, controlling the total amount of carbohydrates in your meal or snack is more important than making sure you include all of the food groups at every meal. You may interchange carbohydrate containing foods (dairy, starches, and fruits). The meal plan below is an example of a 2000 calorie diet using carbohydrate counting. This meal plan has 17 carbohydrate servings. Breakfast  1 cup oatmeal (2 carb servings).    cup light yogurt (1 carb serving).   1 cup blueberries (1 carb serving).    cup almonds.  Snack  1 large apple (2 carb servings).   1 low-fat string cheese stick.  Lunch  Chicken breast salad.   1 cup spinach.    cup chopped tomatoes.   2 oz chicken breast, sliced.   2 tbs low-fat [place] dressing.   12 whole-wheat crackers (2 carb servings).   12 to 15 grapes (1 carb serving).   1 cup low-fat milk (1 carb serving).  Snack  1 cup carrots.    cup hummus (1 carb serving).  Dinner  3 oz broiled salmon.   1 cup brown rice (3 carb servings).  Snack  1  cups steamed broccoli (1 carb serving) drizzled with 1 tsp olive oil and lemon juice.   1 cup light pudding (2 carb servings).  DIABETES MEAL PLANNING WORKSHEET Your dietician can use this worksheet to help you decide how many servings of foods and what types of foods are right for you.  BREAKFAST Food Group and Servings / Carb Servings Grain/Starches __________________________________ Dairy __________________________________________ Vegetable  ______________________________________ Fruit ___________________________________________ Meat __________________________________________ Fat ____________________________________________ LUNCH Food Group and Servings / Carb Servings Grain/Starches ___________________________________ Dairy ___________________________________________ Fruit ____________________________________________ Meat ___________________________________________ Fat _____________________________________________ Laural Golden Food Group and Servings / Carb Servings Grain/Starches ___________________________________ Dairy ___________________________________________ Fruit ____________________________________________ Meat ___________________________________________ Fat _____________________________________________ SNACKS Food Group and Servings / Carb Servings Grain/Starches ___________________________________ Dairy ___________________________________________ Vegetable _______________________________________ Fruit ____________________________________________ Meat ___________________________________________ Fat _____________________________________________ DAILY TOTALS Starches _________________________ Vegetable ________________________ Fruit ____________________________ Dairy ____________________________ Meat ____________________________ Fat ______________________________ Document Released: 05/30/2005 Document Revised: 08/22/2011 Document Reviewed: 04/10/2009 [employer] Patient Information 2012 [place], [place].  Exercise to Lose Weight Exercise and a healthy diet may help you lose weight. Your doctor may suggest specific exercises. EXERCISE IDEAS AND TIPS  Choose low-cost things you enjoy doing, such as walking, bicycling, or exercising to workout videos.   Take stairs instead of the elevator.   Walk during your lunch break.   Park your car further away from work or school.   Go to a gym or an exercise class.    Start with 5 to 10 minutes of exercise each day. Build up to 30 minutes of exercise 4 to 6 days a week.   Wear shoes with good support and comfortable clothes.   Stretch before and after working out.   Work out until you breathe harder and your heart beats faster.   Drink extra water when you exercise.   Do not do so much that you hurt yourself, feel dizzy, or get very short of breath.  Exercises that burn about 150 calories:  Running 1  miles in 15 minutes.   Playing volleyball for 45 to 60 minutes.   Washing and waxing a car for 45  to 60 minutes.   Playing touch football for 45 minutes.   Walking 1  miles in 35 minutes.   Pushing a stroller 1  miles in 30 minutes.   Playing basketball for 30 minutes.   Raking leaves for 30 minutes.   Bicycling 5 miles in 30 minutes.   Walking 2 miles in 30 minutes.   Dancing for 30 minutes.   Shoveling snow for 15 minutes.   Swimming laps for 20 minutes.   Walking up stairs for 15 minutes.   Bicycling 4 miles in 15 minutes.   Gardening for 30 to 45 minutes.   Jumping rope for 15 minutes.   Washing windows or floors for 45 to 60 minutes.  Document Released: 10/05/2010 Document Revised: 05/15/2011 Document Reviewed: 10/05/2010 [facility] Patient Information 2012 [place], [place].    Follow up with Dr. Chestine Spore! Remember to schedule your colonoscopy with Dr. Loreta Ave

## 2012-06-06 LAB — URINALYSIS W MICROSCOPIC + REFLEX CULTURE
Bacteria, UA: NONE SEEN
Bilirubin Urine: NEGATIVE
Casts: NONE SEEN
Glucose, UA: >1000 mg/dL — AB
Hgb urine dipstick: NEGATIVE
Ketones, ur: 15 mg/dL — AB
Leukocytes, UA: NEGATIVE
Nitrite: NEGATIVE
Protein, ur: NEGATIVE mg/dL
Specific Gravity, Urine: >1.03 — ABNORMAL HIGH (ref 1.005–1.030)
Squamous Epithelial / HPF: NONE SEEN
Urobilinogen, UA: 0.2 mg/dL (ref 0.0–1.0)
pH: 5 (ref 5.0–8.0)

## 2012-06-08 ENCOUNTER — Other Ambulatory Visit: Payer: Self-pay | Admitting: *Deleted

## 2012-06-08 DIAGNOSIS — N390 Urinary tract infection, site not specified: Secondary | ICD-10-CM

## 2012-06-08 MED ORDER — NITROFURANTOIN MONOHYD MACRO 100 MG PO CAPS: 100.0000 mg | ORAL_CAPSULE | Freq: Two times a day (BID) | ORAL | Status: DC

## 2012-06-09 LAB — URINE CULTURE: Colony Count: >=100000

## 2012-06-11 ENCOUNTER — Encounter: Payer: Self-pay | Admitting: Gynecology

## 2012-06-19 ENCOUNTER — Encounter: Payer: Self-pay | Admitting: Gynecology

## 2012-06-22 ENCOUNTER — Other Ambulatory Visit: Payer: Self-pay | Admitting: Gynecology

## 2012-06-22 DIAGNOSIS — N390 Urinary tract infection, site not specified: Secondary | ICD-10-CM

## 2012-06-25 ENCOUNTER — Other Ambulatory Visit: Payer: BC Managed Care – PPO

## 2012-06-25 DIAGNOSIS — N390 Urinary tract infection, site not specified: Secondary | ICD-10-CM

## 2012-06-26 LAB — URINALYSIS W MICROSCOPIC + REFLEX CULTURE
Bacteria, UA: NONE SEEN
Bilirubin Urine: NEGATIVE
Casts: NONE SEEN
Glucose, UA: >1000 mg/dL — AB
Hgb urine dipstick: NEGATIVE
Ketones, ur: NEGATIVE mg/dL
Leukocytes, UA: NEGATIVE
Nitrite: NEGATIVE
Protein, ur: NEGATIVE mg/dL
pH: 7 (ref 5.0–8.0)

## 2014-03-21 ENCOUNTER — Telehealth: Payer: Self-pay | Admitting: *Deleted

## 2014-03-21 NOTE — Telephone Encounter (Signed)
Pt called requesting name of dermatologist I gave patient name [facility] dermatologist and Dr. Allyn Kenner.

## 2014-06-23 ENCOUNTER — Ambulatory Visit (INDEPENDENT_AMBULATORY_CARE_PROVIDER_SITE_OTHER): Payer: BC Managed Care – PPO | Admitting: Gynecology

## 2014-06-23 ENCOUNTER — Encounter: Payer: Self-pay | Admitting: Gynecology

## 2014-06-23 ENCOUNTER — Other Ambulatory Visit (HOSPITAL_COMMUNITY)
Admission: RE | Admit: 2014-06-23 | Discharge: 2014-06-23 | Disposition: A | Discharge status: Home / Self Care | Payer: BC Managed Care – PPO | Source: Ambulatory Visit | Attending: Gynecology | Admitting: Gynecology

## 2014-06-23 VITALS — BP 130/88 | Ht 63.25 in | Wt 214.0 lb

## 2014-06-23 DIAGNOSIS — Z23 Encounter for immunization: Secondary | ICD-10-CM

## 2014-06-23 DIAGNOSIS — Z01419 Encounter for gynecological examination (general) (routine) without abnormal findings: Secondary | ICD-10-CM | POA: Insufficient documentation

## 2014-06-23 DIAGNOSIS — Z1151 Encounter for screening for human papillomavirus (HPV): Secondary | ICD-10-CM | POA: Diagnosis present

## 2014-06-23 LAB — URINALYSIS W MICROSCOPIC + REFLEX CULTURE
Bilirubin Urine: NEGATIVE
Glucose, UA: 250 mg/dL — AB
Hgb urine dipstick: NEGATIVE
Ketones, ur: NEGATIVE mg/dL
Leukocytes, UA: NEGATIVE
Nitrite: NEGATIVE
Protein, ur: NEGATIVE mg/dL
Specific Gravity, Urine: >1.03 — ABNORMAL HIGH (ref 1.005–1.030)
Urobilinogen, UA: 0.2 mg/dL (ref 0.0–1.0)
pH: 5 (ref 5.0–8.0)

## 2014-06-23 NOTE — Progress Notes (Signed)
Becky Decker [birth year] [id number]   History:    53 y.o.  for annual gyn exam several weeks ago she had a few days of headaches but she attributed to stress. She was asymptomatic today. She is now seeing Dr. Jeanann Lewandowsky who is treating her for type 2 diabetes. Patient has borderline hypercholesterolemia. She is getting her blood work done at his office. Patient has yet to have her colonoscopy. Her last mammogram was in 2013. Patient with no past history of abnormal Pap smears. Patient is menopausal with no vasomotor symptoms and on no hormone replacement therapy. Before she was treated for diabetes she was having dysuria nocturia and was treated for suspected overactive bladder. She is having no issues now and is no longer on medication. She denies any stress urine incontinence or any vaginal bleeding. Patient requesting flu vaccine today. Patient was weighing 244 pounds last year and is down to 214 pounds now.  Past medical history,surgical history, family history and social history were all reviewed and documented in the EPIC chart.  Gynecologic History Patient's last menstrual period was 03/13/2012. Contraception: post menopausal status Last Pap: 2012. Results were: normal Last mammogram: 2013. Results were: normal  Obstetric History OB History  Gravida Para Term Preterm AB SAB TAB Ectopic Multiple Living  2 2 2       2     # Outcome Date GA Lbr Len/2nd Weight Sex Delivery Anes PTL Lv  2 TRM     M CS  N Y  1 TRM     F CS  N Y       ROS: A ROS was performed and pertinent positives and negatives are included in the history.  GENERAL: No fevers or chills. HEENT: No change in vision, no earache, sore throat or sinus congestion. NECK: No pain or stiffness. CARDIOVASCULAR: No chest pain or pressure. No palpitations. PULMONARY: No shortness of breath, cough or wheeze. GASTROINTESTINAL: No abdominal pain, nausea, vomiting or diarrhea, melena or bright red blood per rectum.  GENITOURINARY: No urinary frequency, urgency, hesitancy or dysuria. MUSCULOSKELETAL: No joint or muscle pain, no back pain, no recent trauma. DERMATOLOGIC: No rash, no itching, no lesions. ENDOCRINE: No polyuria, polydipsia, no heat or cold intolerance. No recent change in weight. HEMATOLOGICAL: No anemia or easy bruising or bleeding. NEUROLOGIC: No headache, seizures, numbness, tingling or weakness. PSYCHIATRIC: No depression, no loss of interest in normal activity or change in sleep pattern.     Exam: chaperone present  BP 130/88  Ht 5' 3.25" (1.607 m)  Wt 214 lb (97.07 kg)  BMI 37.59 kg/m2  LMP 03/13/2012  Body mass index is 37.59 kg/(m^2).  General appearance : Well developed well nourished female. No acute distress HEENT: Neck supple, trachea midline, no carotid bruits, no thyroidmegaly Lungs: Clear to auscultation, no rhonchi or wheezes, or rib retractions  Heart: Regular rate and rhythm, no murmurs or gallops Breast:Examined in sitting and supine position were symmetrical in appearance, no palpable masses or tenderness,  no skin retraction, no nipple inversion, no nipple discharge, no skin discoloration, no axillary or supraclavicular lymphadenopathy Abdomen: no palpable masses or tenderness, no rebound or guarding Extremities: no edema or skin discoloration or tenderness  Pelvic:  Bartholin, Urethra, Skene Glands: Within normal limits             Vagina: No gross lesions or discharge  Cervix: No gross lesions or discharge  Uterus  anteverted, normal size, shape and consistency, non-tender and mobile  Adnexa  Without masses or tenderness  Anus and perineum  normal   Rectovaginal  normal sphincter tone without palpated masses or tenderness             Hemoccult cards were provided   Urinalysis trace glucose  Assessment/Plan:  53 y.o. female for annual exam with type 2 diabetes under control with hypoglycemic agent bite her PCP. She has been getting her blood work for her  primary doctor. Patient received the flu vaccine today. Pap smear was done today. Patient was provided with a requisition to schedule mammogram. We also gave her the name of community gastroenterologist for her to schedule her screening cholesterol. She was also provided with fecal Hemoccult cards to submit to the office for testing. We discussed importance of calcium and vitamin D and regular exercise for osteoporosis prevention. Pap smear was done today.   Terrance Mass MD, 10:52 AM 06/23/2014

## 2014-06-23 NOTE — Patient Instructions (Signed)

## 2014-06-24 LAB — CYTOLOGY - PAP

## 2014-07-18 ENCOUNTER — Encounter: Payer: Self-pay | Admitting: Gynecology

## 2015-01-10 ENCOUNTER — Other Ambulatory Visit: Payer: Self-pay

## 2015-01-10 DIAGNOSIS — Z1231 Encounter for screening mammogram for malignant neoplasm of breast: Secondary | ICD-10-CM

## 2015-01-19 ENCOUNTER — Encounter (INDEPENDENT_AMBULATORY_CARE_PROVIDER_SITE_OTHER): Payer: Self-pay

## 2015-01-19 ENCOUNTER — Ambulatory Visit
Admission: RE | Admit: 2015-01-19 | Discharge: 2015-01-19 | Disposition: A | Discharge status: Home / Self Care | Payer: BC Managed Care – PPO | Source: Ambulatory Visit

## 2015-01-19 DIAGNOSIS — Z1231 Encounter for screening mammogram for malignant neoplasm of breast: Secondary | ICD-10-CM

## 2015-01-31 ENCOUNTER — Encounter: Payer: BC Managed Care – PPO | Admitting: Internal Medicine

## 2015-10-24 ENCOUNTER — Other Ambulatory Visit: Payer: Self-pay

## 2015-10-24 ENCOUNTER — Encounter: Payer: Self-pay | Admitting: Internal Medicine

## 2015-10-24 DIAGNOSIS — Z1231 Encounter for screening mammogram for malignant neoplasm of breast: Secondary | ICD-10-CM

## 2015-11-16 ENCOUNTER — Encounter: Payer: Self-pay | Admitting: Gynecology

## 2015-11-16 ENCOUNTER — Ambulatory Visit (INDEPENDENT_AMBULATORY_CARE_PROVIDER_SITE_OTHER): Payer: BC Managed Care – PPO | Admitting: Gynecology

## 2015-11-16 VITALS — BP 134/86 | Ht 63.25 in | Wt 207.0 lb

## 2015-11-16 DIAGNOSIS — E7800 Pure hypercholesterolemia, unspecified: Secondary | ICD-10-CM | POA: Diagnosis not present

## 2015-11-16 DIAGNOSIS — Z01419 Encounter for gynecological examination (general) (routine) without abnormal findings: Secondary | ICD-10-CM

## 2015-11-16 DIAGNOSIS — E119 Type 2 diabetes mellitus without complications: Secondary | ICD-10-CM

## 2015-11-16 NOTE — Patient Instructions (Signed)
Patient information: High cholesterol (The Basics)  What is cholesterol? - Cholesterol is a substance that is found in the blood. Everyone has some. It is needed for good health. The problem is, people sometimes have too much cholesterol. Compared with people with normal cholesterol, people with high cholesterol have a higher risk of heart attacks, strokes, and other health problems. The higher your cholesterol, the higher your risk of these problems. Cholesterol levels in your body are determined significantly by your diet. Cholesterol levels may also be related to heart disease. The following material helps to explain this relationship and discusses what you can do to help keep your heart healthy. Not all cholesterol is bad. Low-density lipoprotein (LDL) cholesterol is the "bad" cholesterol. It may cause fatty deposits to build up inside your arteries. High-density lipoprotein (HDL) cholesterol is "good." It helps to remove the "bad" LDL cholesterol from your blood. Cholesterol is a very important risk factor for heart disease. Other risk factors are high blood pressure, smoking, stress, heredity, and weight.  The heart muscle gets its supply of blood through the coronary arteries. If your LDL cholesterol is high and your HDL cholesterol is low, you are at risk for having fatty deposits build up in your coronary arteries. This leaves less room through which blood can flow. Without sufficient blood and oxygen, the heart muscle cannot function properly and you may feel chest pains (angina pectoris). When a coronary artery closes up entirely, a part of the heart muscle may die, causing a heart attack (myocardial infarction).  CHECKING CHOLESTEROL When your caregiver sends your blood to a lab to be analyzed for cholesterol, a complete lipid (fat) profile may be done. With this test, the total amount of cholesterol and levels of LDL and HDL are determined. Triglycerides  are a type of fat that circulates in the blood and can also be used to determine heart disease risk. Are there different types of cholesterol? - Yes, there are a few different types. If you get a cholesterol test, you may hear your doctor or nurse talk about: Total cholesterol  LDL cholesterol - Some people call this the "bad" cholesterol. That's because having high LDL levels raises your risk of heart attacks, strokes, and other health problems.  HDL cholesterol - Some people call this the "good" cholesterol. That's because having high HDL levels lowers your risk of heart attacks, strokes, and other health problems.  Non-HDL cholesterol - Non-HDL cholesterol is your total cholesterol minus your HDL cholesterol.  Triglycerides - Triglycerides are not cholesterol. They are a type of fat. But they often get measured when cholesterol is measured. (Having high triglycerides also seems to increase the risk of heart attacks and strokes.)   Keep in mind, though, that many people who cannot meet these goals still have a low risk of heart attacks and strokes. What should I do if my doctor tells me I have high cholesterol? - Ask your doctor what your overall risk of heart attacks and strokes is. High cholesterol, by itself, is not always a reason to worry. Having high cholesterol is just one of many things that can increase your risk of heart attacks and strokes. Other factors that increase your risk include:  Cigarette smoking  High blood pressure  Having a parent, sister, or brother who got heart disease at a young age (Young, in this case, means younger than 55 for men and younger than 65 for women.)  Being a man (Women are at risk, too, but men   have a higher risk.)  Older age  If you are at high risk of heart attacks and strokes, having high cholesterol is a problem. On the other hand, if you have are at low risk, having high cholesterol may not mean much. Should I take medicine to lower cholesterol? - Not  everyone who has high cholesterol needs medicines. Your doctor or nurse will decide if you need them based on your age, family history, and other health concerns.  You should probably take a cholesterol-lowering medicine called a statin if you: Already had a heart attack or stroke  Have known heart disease  Have diabetes  Have a condition called peripheral artery disease, which makes it painful to walk, and happens when the arteries in your legs get clogged with fatty deposits  Have an abdominal aortic aneurysm, which is a widening of the main artery in the belly  Most people with any of the conditions listed above should take a statin no matter what their cholesterol level is. If your doctor or nurse puts you on a statin, stay on it. The medicine may not make you feel any different. But it can help prevent heart attacks, strokes, and death.  Can I lower my cholesterol without medicines? - Yes, you can lower your cholesterol some by:  Avoiding red meat, butter, fried foods, cheese, and other foods that have a lot of saturated fat  Losing weight (if you are overweight)  Being more active Even if these steps do little to change your cholesterol, they can improve your health in many ways.                                                   Cholesterol Control Diet  CONTROLLING CHOLESTEROL WITH DIET Although exercise and lifestyle factors are important, your diet is key. That is because certain foods are known to raise cholesterol and others to lower it. The goal is to balance foods for their effect on cholesterol and more importantly, to replace saturated and trans fat with other types of fat, such as monounsaturated fat, polyunsaturated fat, and omega-3 fatty acids. On average, a person should consume no more than 15 to 17 g of saturated fat daily. Saturated and trans fats are considered "bad" fats, and they will raise LDL cholesterol. Saturated fats are primarily found in animal products such as  meats, butter, and cream. However, that does not mean you need to sacrifice all your favorite foods. Today, there are good tasting, low-fat, low-cholesterol substitutes for most of the things you like to eat. Choose low-fat or nonfat alternatives. Choose round or loin cuts of red meat, since these types of cuts are lowest in fat and cholesterol. Chicken (without the skin), fish, veal, and ground turkey breast are excellent choices. Eliminate fatty meats, such as hot dogs and salami. Even shellfish have little or no saturated fat. Have a 3 oz (85 g) portion when you eat lean meat, poultry, or fish. Trans fats are also called "partially hydrogenated oils." They are oils that have been scientifically manipulated so that they are solid at room temperature resulting in a longer shelf life and improved taste and texture of foods in which they are added. Trans fats are found in stick margarine, some tub margarines, cookies, crackers, and baked goods.  When baking and cooking, oils are an excellent substitute   for butter. The monounsaturated oils are especially beneficial since it is believed they lower LDL and raise HDL. The oils you should avoid entirely are saturated tropical oils, such as coconut and palm.  Remember to eat liberally from food groups that are naturally free of saturated and trans fat, including fish, fruit, vegetables, beans, grains (barley, rice, couscous, bulgur wheat), and pasta (without cream sauces).  IDENTIFYING FOODS THAT LOWER CHOLESTEROL  Soluble fiber may lower your cholesterol. This type of fiber is found in fruits such as apples, vegetables such as broccoli, potatoes, and carrots, legumes such as beans, peas, and lentils, and grains such as barley. Foods fortified with plant sterols (phytosterol) may also lower cholesterol. You should eat at least 2 g per day of these foods for a cholesterol lowering effect.  Read package labels to identify low-saturated fats, trans fats free, and  low-fat foods at the supermarket. Select cheeses that have only 2 to 3 g saturated fat per ounce. Use a heart-healthy tub margarine that is free of trans fats or partially hydrogenated oil. When buying baked goods (cookies, crackers), avoid partially hydrogenated oils. Breads and muffins should be made from whole grains (whole-wheat or whole oat flour, instead of "flour" or "enriched flour"). Buy non-creamy canned soups with reduced salt and no added fats.  FOOD PREPARATION TECHNIQUES  Never deep-fry. If you must fry, either stir-fry, which uses very little fat, or use non-stick cooking sprays. When possible, broil, bake, or roast meats, and steam vegetables. Instead of dressing vegetables with butter or margarine, use lemon and herbs, applesauce and cinnamon (for squash and sweet potatoes), nonfat yogurt, salsa, and low-fat dressings for salads.  LOW-SATURATED FAT / LOW-FAT FOOD SUBSTITUTES Meats / Saturated Fat (g)  Avoid: Steak, marbled (3 oz/85 g) / 11 g   Choose: Steak, lean (3 oz/85 g) / 4 g   Avoid: Hamburger (3 oz/85 g) / 7 g   Choose: Hamburger, lean (3 oz/85 g) / 5 g   Avoid: Ham (3 oz/85 g) / 6 g   Choose: Ham, lean cut (3 oz/85 g) / 2.4 g   Avoid: Chicken, with skin, dark meat (3 oz/85 g) / 4 g   Choose: Chicken, skin removed, dark meat (3 oz/85 g) / 2 g   Avoid: Chicken, with skin, light meat (3 oz/85 g) / 2.5 g   Choose: Chicken, skin removed, light meat (3 oz/85 g) / 1 g  Dairy / Saturated Fat (g)  Avoid: Whole milk (1 cup) / 5 g   Choose: Low-fat milk, 2% (1 cup) / 3 g   Choose: Low-fat milk, 1% (1 cup) / 1.5 g   Choose: Skim milk (1 cup) / 0.3 g   Avoid: Hard cheese (1 oz/28 g) / 6 g   Choose: Skim milk cheese (1 oz/28 g) / 2 to 3 g   Avoid: Cottage cheese, 4% fat (1 cup) / 6.5 g   Choose: Low-fat cottage cheese, 1% fat (1 cup) / 1.5 g   Avoid: Ice cream (1 cup) / 9 g   Choose: Sherbet (1 cup) / 2.5 g   Choose: Nonfat frozen yogurt (1 cup) / 0.3 g    Choose: Frozen fruit bar / trace   Avoid: Whipped cream (1 tbs) / 3.5 g   Choose: Nondairy whipped topping (1 tbs) / 1 g  Condiments / Saturated Fat (g)  Avoid: Mayonnaise (1 tbs) / 2 g   Choose: Low-fat mayonnaise (1 tbs) / 1 g   Avoid:   Butter (1 tbs) / 7 g   Choose: Extra light margarine (1 tbs) / 1 g   Avoid: Coconut oil (1 tbs) / 11.8 g   Choose: Olive oil (1 tbs) / 1.8 g   Choose: Corn oil (1 tbs) / 1.7 g   Choose: Safflower oil (1 tbs) / 1.2 g   Choose: Sunflower oil (1 tbs) / 1.4 g   Choose: Soybean oil (1 tbs) / 2.4 g   Choose: Canola oil (1 tbs) / 1 g  Exercise to Lose Weight Exercise and a healthy diet may help you lose weight. Your doctor may suggest specific exercises. EXERCISE IDEAS AND TIPS  Choose low-cost things you enjoy doing, such as walking, bicycling, or exercising to workout videos.   Take stairs instead of the elevator.   Walk during your lunch break.   Park your car further away from work or school.   Go to a gym or an exercise class.   Start with 5 to 10 minutes of exercise each day. Build up to 30 minutes of exercise 4 to 6 days a week.   Wear shoes with good support and comfortable clothes.   Stretch before and after working out.   Work out until you breathe harder and your heart beats faster.   Drink extra water when you exercise.   Do not do so much that you hurt yourself, feel dizzy, or get very short of breath.  Exercises that burn about 150 calories:  Running 1  miles in 15 minutes.   Playing volleyball for 45 to 60 minutes.   Washing and waxing a car for 45 to 60 minutes.   Playing touch football for 45 minutes.   Walking 1  miles in 35 minutes.   Pushing a stroller 1  miles in 30 minutes.   Playing basketball for 30 minutes.   Raking leaves for 30 minutes.   Bicycling 5 miles in 30 minutes.   Walking 2 miles in 30 minutes.   Dancing for 30 minutes.   Shoveling snow for 15 minutes.   Swimming laps  for 20 minutes.   Walking up stairs for 15 minutes.   Bicycling 4 miles in 15 minutes.   Gardening for 30 to 45 minutes.   Jumping rope for 15 minutes.   Washing windows or floors for 45 to 60 minutes.  Document Released: 10/05/2010 Document Revised: 05/15/2011 Document Reviewed: 10/05/2010 [employer] Patient Information 2012 [employer].                                           

## 2015-11-16 NOTE — Progress Notes (Signed)
Becky Decker Sep 18, 1960 [id number]   History:    55 y.o.  for annual gyn exam who has history of type 2 diabetes on oral hypoglycemic agent treated by her PCP and also history of hypercholesterolemia. She was seen Dr. Jeanann Lewandowsky has not seen him recently. She states her blood work was done about a year ago. Patient is already scheduled for colonoscopy later this month.Patient with no past history of abnormal Pap smears. Patient is menopausal with no vasomotor symptoms and on no hormone replacement therapy. Patient in 2014 was weighing 244 pounds and is down to 207 pounds now.  Past medical history,surgical history, family history and social history were all reviewed and documented in the EPIC chart.  Gynecologic History Patient's last menstrual period was 03/13/2012. Contraception: post menopausal status Last Pap: 2015. Results were: normal Last mammogram: 2016. Results were: normal  Obstetric History OB History  Gravida Para Term Preterm AB SAB TAB Ectopic Multiple Living  2 2 2       2     # Outcome Date GA Lbr Len/2nd Weight Sex Delivery Anes PTL Lv  2 Term     M CS-Unspec  N Y  1 Term     F CS-Unspec  N Y       ROS: A ROS was performed and pertinent positives and negatives are included in the history.  GENERAL: No fevers or chills. HEENT: No change in vision, no earache, sore throat or sinus congestion. NECK: No pain or stiffness. CARDIOVASCULAR: No chest pain or pressure. No palpitations. PULMONARY: No shortness of breath, cough or wheeze. GASTROINTESTINAL: No abdominal pain, nausea, vomiting or diarrhea, melena or bright red blood per rectum. GENITOURINARY: No urinary frequency, urgency, hesitancy or dysuria. MUSCULOSKELETAL: No joint or muscle pain, no back pain, no recent trauma. DERMATOLOGIC: No rash, no itching, no lesions. ENDOCRINE: No polyuria, polydipsia, no heat or cold intolerance. No recent change in weight. HEMATOLOGICAL: No anemia or easy bruising or  bleeding. NEUROLOGIC: No headache, seizures, numbness, tingling or weakness. PSYCHIATRIC: No depression, no loss of interest in normal activity or change in sleep pattern.     Exam: chaperone present  BP 134/86 mmHg  Ht 5' 3.25" (1.607 m)  Wt 207 lb (93.895 kg)  BMI 36.36 kg/m2  LMP 03/13/2012  Body mass index is 36.36 kg/(m^2).  General appearance : Well developed well nourished female. No acute distress HEENT: Eyes: no retinal hemorrhage or exudates,  Neck supple, trachea midline, no carotid bruits, no thyroidmegaly Lungs: Clear to auscultation, no rhonchi or wheezes, or rib retractions  Heart: Regular rate and rhythm, no murmurs or gallops Breast:Examined in sitting and supine position were symmetrical in appearance, no palpable masses or tenderness,  no skin retraction, no nipple inversion, no nipple discharge, no skin discoloration, no axillary or supraclavicular lymphadenopathy Abdomen: no palpable masses or tenderness, no rebound or guarding Extremities: no edema or skin discoloration or tenderness  Pelvic:  Bartholin, Urethra, Skene Glands: Within normal limits             Vagina: No gross lesions or discharge  Cervix: No gross lesions or discharge  Uterus  anteverted, normal size, shape and consistency, non-tender and mobile  Adnexa  Without masses or tenderness  Anus and perineum  normal   Rectovaginal  normal sphincter tone without palpated masses or tenderness             Hemoccult colonoscopy this month     Assessment/Plan:  55 y.o. female for  annual exam will return later this week for the following screening blood work: Comprehensive metabolic panel, CBC, TSH, fasting lipid profile and urinalysis. Patient considering transferring her care to a different internist we'll wait for these results. She is scheduled to have her colonoscopy this month. She was reminded schedule her mammogram in May of this year. Pap smear not done today according to the new  guidelines.   Terrance Mass MD, 12:33 PM 11/16/2015

## 2015-11-17 ENCOUNTER — Ambulatory Visit (AMBULATORY_SURGERY_CENTER): Payer: Self-pay | Admitting: *Deleted

## 2015-11-17 VITALS — Ht 64.0 in | Wt 207.0 lb

## 2015-11-17 DIAGNOSIS — Z1211 Encounter for screening for malignant neoplasm of colon: Secondary | ICD-10-CM

## 2015-11-17 MED ORDER — NA SULFATE-K SULFATE-MG SULF 17.5-3.13-1.6 GM/177ML PO SOLN: 1.0000 | Freq: Once | ORAL | Status: DC

## 2015-11-17 NOTE — Progress Notes (Signed)
No egg or soy allergy known to patient  No issues with past sedation with any surgeries  or procedures, no intubation problems  No diet pills per patient No home 02 use per patient  No blood thinners per patient   emmi video to e mail         [email or web address]

## 2015-11-20 ENCOUNTER — Other Ambulatory Visit: Payer: BC Managed Care – PPO

## 2015-11-20 ENCOUNTER — Encounter: Payer: Self-pay | Admitting: Gynecology

## 2015-11-20 DIAGNOSIS — Z01419 Encounter for gynecological examination (general) (routine) without abnormal findings: Secondary | ICD-10-CM

## 2015-11-20 LAB — URINALYSIS W MICROSCOPIC + REFLEX CULTURE
BILIRUBIN URINE: NEGATIVE
Bacteria, UA: NONE SEEN [HPF]
Casts: NONE SEEN [LPF]
LEUKOCYTES UA: NEGATIVE
NITRITE: NEGATIVE
PH: 5.5 (ref 5.0–8.0)
Protein, ur: NEGATIVE
RBC / HPF: NONE SEEN RBC/HPF (ref <=2)
SPECIFIC GRAVITY, URINE: 1.025 (ref 1.001–1.035)
WBC UA: NONE SEEN WBC/HPF (ref <=5)
YEAST: NONE SEEN [HPF]

## 2015-11-20 LAB — COMPREHENSIVE METABOLIC PANEL
ALK PHOS: 93 U/L (ref 33–130)
ALT: 32 U/L — ABNORMAL HIGH (ref 6–29)
AST: 19 U/L (ref 10–35)
Albumin: 3.8 g/dL (ref 3.6–5.1)
BILIRUBIN TOTAL: 1.1 mg/dL (ref 0.2–1.2)
BUN: 16 mg/dL (ref 7–25)
CALCIUM: 8.8 mg/dL (ref 8.6–10.4)
CO2: 26 mmol/L (ref 20–31)
CREATININE: 0.67 mg/dL (ref 0.50–1.05)
Chloride: 102 mmol/L (ref 98–110)
Glucose, Bld: 259 mg/dL — ABNORMAL HIGH (ref 65–99)
Potassium: 4.2 mmol/L (ref 3.5–5.3)
Sodium: 137 mmol/L (ref 135–146)
Total Protein: 7 g/dL (ref 6.1–8.1)

## 2015-11-20 LAB — CBC WITH DIFFERENTIAL/PLATELET
BASOS ABS: 0 10*3/uL (ref 0.0–0.1)
Basophils Relative: 0 % (ref 0–1)
EOS ABS: 0.2 10*3/uL (ref 0.0–0.7)
EOS PCT: 3 % (ref 0–5)
HCT: 43.9 % (ref 36.0–46.0)
Hemoglobin: 14.4 g/dL (ref 12.0–15.0)
LYMPHS ABS: 1.3 10*3/uL (ref 0.7–4.0)
Lymphocytes Relative: 24 % (ref 12–46)
MCH: 29.4 pg (ref 26.0–34.0)
MCHC: 32.8 g/dL (ref 30.0–36.0)
MCV: 89.6 fL (ref 78.0–100.0)
MONO ABS: 0.4 10*3/uL (ref 0.1–1.0)
MONOS PCT: 7 % (ref 3–12)
MPV: 9.6 fL (ref 8.6–12.4)
Neutro Abs: 3.6 10*3/uL (ref 1.7–7.7)
Neutrophils Relative %: 66 % (ref 43–77)
PLATELETS: 258 10*3/uL (ref 150–400)
RBC: 4.9 MIL/uL (ref 3.87–5.11)
RDW: 13 % (ref 11.5–15.5)
WBC: 5.5 10*3/uL (ref 4.0–10.5)

## 2015-11-20 LAB — LIPID PANEL
CHOLESTEROL: 225 mg/dL — AB (ref 125–200)
HDL: 46 mg/dL (ref >=46)
LDL CALC: 154 mg/dL — AB (ref <130)
TRIGLYCERIDES: 124 mg/dL (ref <150)
Total CHOL/HDL Ratio: 4.9 Ratio (ref <=5.0)
VLDL: 25 mg/dL (ref <30)

## 2015-11-20 LAB — TSH: TSH: 0.82 mIU/L

## 2015-11-20 NOTE — Addendum Note (Signed)
Addended by: Thurnell Garbe A on: 11/20/2015 10:07 AM   Modules accepted: SmartSet

## 2015-11-30 ENCOUNTER — Ambulatory Visit (AMBULATORY_SURGERY_CENTER): Payer: BC Managed Care – PPO | Admitting: Internal Medicine

## 2015-11-30 ENCOUNTER — Encounter: Payer: Self-pay | Admitting: Internal Medicine

## 2015-11-30 VITALS — BP 130/91 | HR 73 | Temp 97.5°F | Resp 36 | Ht 64.0 in | Wt 207.0 lb

## 2015-11-30 DIAGNOSIS — D122 Benign neoplasm of ascending colon: Secondary | ICD-10-CM

## 2015-11-30 DIAGNOSIS — Z1211 Encounter for screening for malignant neoplasm of colon: Secondary | ICD-10-CM | POA: Diagnosis not present

## 2015-11-30 MED ORDER — SODIUM CHLORIDE 0.9 % IV SOLN: 500.0000 mL | INTRAVENOUS | Status: DC

## 2015-11-30 NOTE — Op Note (Signed)
[place] Patient Name: Becky Decker Procedure Date: 11/30/2015 10:28 AM MRN: [id number] Endoscopist: Jerene Bears , MD Age: 55 Referring MD:  Date of Birth: Sep 22, 1960 Gender: Female Procedure:                Colonoscopy Indications:              Screening for colorectal malignant neoplasm, This                            is the patient's first colonoscopy Medicines:                Monitored Anesthesia Care Procedure:                Pre-Anesthesia Assessment:                           - Prior to the procedure, a History and Physical                            was performed, and patient medications and                            allergies were reviewed. The patient's tolerance of                            previous anesthesia was also reviewed. The risks                            and benefits of the procedure and the sedation                            options and risks were discussed with the patient.                            All questions were answered, and informed consent                            was obtained. Prior Anticoagulants: The patient has                            taken no previous anticoagulant or antiplatelet                            agents. ASA Grade Assessment: II - A patient with                            mild systemic disease. After reviewing the risks                            and benefits, the patient was deemed in                            satisfactory condition to undergo the procedure.  After obtaining informed consent, the colonoscope                            was passed under direct vision. Throughout the                            procedure, the patient's blood pressure, pulse, and                            oxygen saturations were monitored continuously. The                            Model PCF-H190L [id number]) scope was introduced                            through the anus and advanced to the the  cecum,                            identified by appendiceal orifice and ileocecal                            valve. The colonoscopy was performed without                            difficulty. The patient tolerated the procedure                            well. The quality of the bowel preparation was                            good. The ileocecal valve, appendiceal orifice, and                            rectum were photographed. Scope In: 10:32:50 AM Scope Out: 10:49:01 AM Scope Withdrawal Time: 0 hours 12 minutes 45 seconds  Total Procedure Duration: 0 hours 16 minutes 11 seconds  Findings:      The digital rectal exam was normal.      A 5 mm polyp was found in the ascending colon. The polyp was sessile.       The polyp was removed with a cold snare. Resection and retrieval were       complete.      Multiple small and large-mouthed diverticula were found in the sigmoid       colon, descending colon and ascending colon.      Internal hemorrhoids were found during retroflexion. The hemorrhoids       were small. Complications:            No immediate complications. Estimated Blood Loss:     Estimated blood loss was minimal. Impression:               - One 5 mm polyp in the ascending colon, removed                            with a cold snare. Resected and retrieved.                           -  Mild diverticulosis in the sigmoid colon, in the                            descending colon and in the ascending colon.                           - Internal hemorrhoids. Recommendation:           - Patient has a contact number available for                            emergencies. The signs and symptoms of potential                            delayed complications were discussed with the                            patient. Return to normal activities tomorrow.                            Written discharge instructions were provided to the                            patient.                            - Resume previous diet.                           - Continue present medications.                           - Await pathology results.                           - Repeat colonoscopy is recommended for                            surveillance. The colonoscopy date will be                            determined after pathology results from today's                            exam become available for review. Procedure Code(s):        --- Professional ---                           [phone], Colonoscopy, flexible; with removal of                            tumor(s), polyp(s), or other lesion(s) by snare                            technique CPT copyright 2016 American Medical Association. All rights reserved. Lajuan Lines. Hilarie Fredrickson, MD Jerene Bears, MD 11/30/2015 10:53:50 AM This  report has been signed electronically. Number of Addenda: 0

## 2015-11-30 NOTE — Progress Notes (Signed)
Report to PACU, RN, vss, BBS= Clear.  

## 2015-11-30 NOTE — Progress Notes (Signed)
Called to room to assist during endoscopic procedure.  Patient ID and intended procedure confirmed with present staff. Received instructions for my participation in the procedure from the performing physician.  

## 2015-11-30 NOTE — Patient Instructions (Signed)
YOU HAD AN ENDOSCOPIC PROCEDURE TODAY AT [facility]:   Refer to the procedure report that was given to you for any specific questions about what was found during the examination.  If the procedure report does not answer your questions, please call your gastroenterologist to clarify.  If you requested that your care partner not be given the details of your procedure findings, then the procedure report has been included in a sealed envelope for you to review at your convenience later.  YOU SHOULD EXPECT: Some feelings of bloating in the abdomen. Passage of more gas than usual.  Walking can help get rid of the air that was put into your GI tract during the procedure and reduce the bloating. If you had a lower endoscopy (such as a colonoscopy or flexible sigmoidoscopy) you may notice spotting of blood in your stool or on the toilet paper. If you underwent a bowel prep for your procedure, you may not have a normal bowel movement for a few days.  Please Note:  You might notice some irritation and congestion in your nose or some drainage.  This is from the oxygen used during your procedure.  There is no need for concern and it should clear up in a day or so.  SYMPTOMS TO REPORT IMMEDIATELY:   Following lower endoscopy (colonoscopy or flexible sigmoidoscopy):  Excessive amounts of blood in the stool  Significant tenderness or worsening of abdominal pains  Swelling of the abdomen that is new, acute  Fever of 100F or higher    For urgent or emergent issues, a gastroenterologist can be reached at any hour by calling [phone].   DIET: Your first meal following the procedure should be a small meal and then it is ok to progress to your normal diet. Heavy or fried foods are harder to digest and may make you feel nauseous or bloated.  Likewise, meals heavy in dairy and vegetables can increase bloating.  Drink plenty of fluids but you should avoid alcoholic beverages for 24  hours.  ACTIVITY:  You should plan to take it easy for the rest of today and you should NOT DRIVE or use heavy machinery until tomorrow (because of the sedation medicines used during the test).    FOLLOW UP: Our staff will call the number listed on your records the next business day following your procedure to check on you and address any questions or concerns that you may have regarding the information given to you following your procedure. If we do not reach you, we will leave a message.  However, if you are feeling well and you are not experiencing any problems, there is no need to return our call.  We will assume that you have returned to your regular daily activities without incident.  If any biopsies were taken you will be contacted by phone or by letter within the next 1-3 weeks.  Please call us at [phone] if you have not heard about the biopsies in 3 weeks.    SIGNATURES/CONFIDENTIALITY: You and/or your care partner have signed paperwork which will be entered into your electronic medical record.  These signatures attest to the fact that that the information above on your After Visit Summary has been reviewed and is understood.  Full responsibility of the confidentiality of this discharge information lies with you and/or your care-partner.  Please read all your handouts given to you by your recovery nurse. Thank you for letting us take care of your healthcare needs  today.

## 2015-12-01 ENCOUNTER — Telehealth: Payer: Self-pay | Admitting: *Deleted

## 2015-12-01 NOTE — Telephone Encounter (Signed)
  Follow up Call-  Call back number 11/30/2015  Post procedure Call Back phone  # [phone]  Permission to leave phone message Yes     Patient questions:  Do you have a fever, pain , or abdominal swelling? No. Pain Score  0 *  Have you tolerated food without any problems? Yes.    Have you been able to return to your normal activities? Yes.    Do you have any questions about your discharge instructions: Diet   No. Medications  No. Follow up visit  No.  Do you have questions or concerns about your Care? No.  Actions: * If pain score is 4 or above: No action needed, pain <4.

## 2015-12-05 ENCOUNTER — Encounter: Payer: Self-pay | Admitting: Internal Medicine

## 2016-01-22 ENCOUNTER — Ambulatory Visit: Payer: BC Managed Care – PPO

## 2016-11-27 ENCOUNTER — Encounter: Payer: BC Managed Care – PPO | Admitting: Gynecology

## 2016-11-28 ENCOUNTER — Encounter: Payer: BC Managed Care – PPO | Admitting: Gynecology

## 2017-01-29 ENCOUNTER — Encounter: Payer: Self-pay | Admitting: Gynecology

## 2017-02-17 ENCOUNTER — Encounter: Payer: Self-pay | Admitting: Gynecology

## 2017-02-17 ENCOUNTER — Ambulatory Visit (INDEPENDENT_AMBULATORY_CARE_PROVIDER_SITE_OTHER): Payer: BC Managed Care – PPO | Admitting: Gynecology

## 2017-02-17 VITALS — BP 132/84 | Ht 63.5 in | Wt 199.4 lb

## 2017-02-17 DIAGNOSIS — Z01419 Encounter for gynecological examination (general) (routine) without abnormal findings: Secondary | ICD-10-CM

## 2017-02-17 MED ORDER — GLIMEPIRIDE 4 MG PO TABS: 4.0000 mg | ORAL_TABLET | Freq: Two times a day (BID) | ORAL | 11 refills | Status: DC

## 2017-02-17 NOTE — Progress Notes (Signed)
Becky Decker [birth year] [id number]   History:    56 y.o.  for annual gyn exam who is with no complaints today. She did notice is nodularity on her side of her thigh and his a lipoma on exam today. She does have history of type 2 diabetes and wanted a refill on her oral hypoglycemic agents that she has not seen her endocrinologist in over a year and has no PCP. He also has been treating her for hypercholesterolemia. Patient had a colonoscopy in 2017 benign colon polyps were removed. Patient with no previous history of any abnormal Pap smears. Patient on no hormone replacement therapy. Patient is working hard on losing weight by exercising and eating healthier she was weighing 244 pounds and is down to 199 pounds now.  Past medical history,surgical history, family history and social history were all reviewed and documented in the EPIC chart.  Gynecologic History Patient's last menstrual period was 03/13/2012. Contraception: post menopausal status Last Pap: 2015. Results were: normal Last mammogram: 2016. Results were: normal  Obstetric History OB History  Gravida Para Term Preterm AB Living  2 2 2     2   SAB TAB Ectopic Multiple Live Births          2    # Outcome Date GA Lbr Len/2nd Weight Sex Delivery Anes PTL Lv  2 Term     M CS-Unspec  N LIV  1 Term     F CS-Unspec  N LIV       ROS: A ROS was performed and pertinent positives and negatives are included in the history.  GENERAL: No fevers or chills. HEENT: No change in vision, no earache, sore throat or sinus congestion. NECK: No pain or stiffness. CARDIOVASCULAR: No chest pain or pressure. No palpitations. PULMONARY: No shortness of breath, cough or wheeze. GASTROINTESTINAL: No abdominal pain, nausea, vomiting or diarrhea, melena or bright red blood per rectum. GENITOURINARY: No urinary frequency, urgency, hesitancy or dysuria. MUSCULOSKELETAL: No joint or muscle pain, no back pain, no recent trauma. DERMATOLOGIC: No rash, no  itching, no lesions. ENDOCRINE: No polyuria, polydipsia, no heat or cold intolerance. No recent change in weight. HEMATOLOGICAL: No anemia or easy bruising or bleeding. NEUROLOGIC: No headache, seizures, numbness, tingling or weakness. PSYCHIATRIC: No depression, no loss of interest in normal activity or change in sleep pattern.     Exam: chaperone present  BP 132/84   Ht 5' 3.5" (1.613 m)   Wt 199 lb 6.4 oz (90.4 kg)   LMP 03/13/2012   BMI 34.77 kg/m   Body mass index is 34.77 kg/m.  General appearance : Well developed well nourished female. No acute distress HEENT: Eyes: no retinal hemorrhage or exudates,  Neck supple, trachea midline, no carotid bruits, no thyroidmegaly Lungs: Clear to auscultation, no rhonchi or wheezes, or rib retractions  Heart: Regular rate and rhythm, no murmurs or gallops Breast:Examined in sitting and supine position were symmetrical in appearance, no palpable masses or tenderness,  no skin retraction, no nipple inversion, no nipple discharge, no skin discoloration, no axillary or supraclavicular lymphadenopathy Abdomen: no palpable masses or tenderness, no rebound or guarding Extremities: no edema or skin discoloration or tenderness  Pelvic:  Bartholin, Urethra, Skene Glands: Within normal limits             Vagina: No gross lesions or discharge  Cervix: No gross lesions or discharge  Uterus  anteverted, normal size, shape and consistency, non-tender and mobile  Adnexa  Without masses  or tenderness  Anus and perineum  normal   Rectovaginal  normal sphincter tone without palpated masses or tenderness             Hemoccult cards provided     Assessment/Plan:  56 y.o. female for annual exam patient was reminded to make an appointment with internal medicine provider for which I have given names. She is to schedule her mammogram. Pap smear done today. She was provided with fecal Hemoccult card to submit to the office for testing. We discussed importance of  calcium vitamin D and weightbearing exercises for osteoporosis prevention.   Terrance Mass MD, 2:40 PM 02/17/2017

## 2017-02-17 NOTE — Addendum Note (Signed)
Addended by: Dorothyann Gibbs on: 02/17/2017 02:46 PM   Modules accepted: Orders

## 2017-02-19 LAB — PAP, TP IMAGING W/ HPV RNA, RFLX HPV TYPE 16,18/45: HPV MRNA, HIGH RISK: NOT DETECTED

## 2017-09-16 HISTORY — PX: ROTATOR CUFF REPAIR: SHX139

## 2018-02-18 ENCOUNTER — Encounter: Payer: BC Managed Care – PPO | Admitting: Obstetrics & Gynecology

## 2018-06-08 ENCOUNTER — Encounter: Payer: BC Managed Care – PPO | Admitting: Obstetrics & Gynecology

## 2018-06-08 DIAGNOSIS — Z0289 Encounter for other administrative examinations: Secondary | ICD-10-CM

## 2018-07-21 ENCOUNTER — Encounter: Payer: Self-pay | Admitting: Women's Health

## 2018-07-21 ENCOUNTER — Ambulatory Visit: Payer: BC Managed Care – PPO | Admitting: Women's Health

## 2018-07-21 ENCOUNTER — Other Ambulatory Visit: Payer: Self-pay | Admitting: Women's Health

## 2018-07-21 VITALS — BP 124/80 | Ht 63.5 in | Wt 187.0 lb

## 2018-07-21 DIAGNOSIS — M791 Myalgia, unspecified site: Secondary | ICD-10-CM

## 2018-07-21 MED ORDER — IBUPROFEN 600 MG PO TABS: ORAL_TABLET | ORAL | 1 refills | Status: DC

## 2018-07-21 NOTE — Telephone Encounter (Signed)
Primary care prescribes this

## 2018-07-21 NOTE — Progress Notes (Signed)
57 year old MBF G2, P2 presents with complaint of sharp pain in right inner upper thigh for the past 2 to 3 weeks.  Last week pain was rated at a 9, this week an 8.  States the pain is more intense with movement of getting up from a chair, getting out of the car, and walking.  Pain starts in the upper inner thigh but does radiate to the back of the thigh.  Minimal/no pain with standing.  Denies injury, change in routine or exercise.  Denies vaginal discharge,   urinary symptoms, abdominal pain, nausea, constipation or fever.  Postmenopausal on no HRT with no bleeding.  Primary care manages labs, due for annual exam.  Exam: Appears well.  Abdomen soft, nontender, no rebound, radiation, no hernia appreciated.  No erythema, ecchymosis or mass noted in right upper inner thigh.  2-3 " area specific to right upper inner thigh slight tenderness with palpation, pain elicited when lying back and leg hanging down able to have full range of motion of leg without difficulty once in the supine position. Able to stand on right or left foot without difficulty or pain.  Pain elicited with squatting down position in the right inner thigh.  Probable muscle strain  Plan: Reviewed since full range of motion, pain not in the hip joint or knee will try anti-inflammatory.  Motrin 600 mg 4 times daily with food for 5 days.  Rest.  If no relief instructed to call and will refer to orthopedist.  Instructed to schedule annual exam.  Overdue for mammogram, breast center information given instructed to schedule.

## 2018-07-21 NOTE — Patient Instructions (Addendum)
Breast center  [phone]  Mammogram   Muscle Pain, Adult Muscle pain (myalgia) may be mild or severe. In most cases, the pain lasts only a short time and it goes away without treatment. It is normal to feel some muscle pain after starting a workout program. Muscles that have not been used often will be sore at first. Muscle pain may also be caused by many other things, including:  Overuse or muscle strain, especially if you are not in shape. This is the most common cause of muscle pain.  Injury.  Bruises.  Viruses, such as the flu.  Infectious diseases.  A chronic condition that causes muscle tenderness, fatigue, and headache (fibromyalgia).  A condition, such as lupus, in which the body's disease-fighting system attacks other organs in the body (autoimmune or rheumatologic diseases).  Certain drugs, including ACE inhibitors and statins.  To diagnose the cause of your muscle pain, your health care provider will do a physical exam and ask questions about the pain and when it began. If you have not had muscle pain for very long, your health care provider may want to wait before doing much testing. If your muscle pain has lasted a long time, your health care provider may want to run tests right away. In some cases, this may include tests to rule out certain conditions or illnesses. Treatment for muscle pain depends on the cause. Home care is often enough to relieve muscle pain. Your health care provider may also prescribe anti-inflammatory medicine. Follow these instructions at home: Activity  If overuse is causing your muscle pain: ? Slow down your activities until the pain goes away. ? Do regular, gentle exercises if you are not usually active. ? Warm up before exercising. Stretch before and after exercising. This can help lower the risk of muscle pain.  Do not continue working out if the pain is very bad. Bad pain could mean that you have injured a muscle. Managing pain and  discomfort   If directed, apply ice to the sore muscle: ? Put ice in a plastic bag. ? Place a towel between your skin and the bag. ? Leave the ice on for 20 minutes, 2-3 times a day.  You may also alternate between applying ice and applying heat as told by your health care provider. To apply heat, use the heat source that your health care provider recommends, such as a moist heat pack or a heating pad. ? Place a towel between your skin and the heat source. ? Leave the heat on for 20-30 minutes. ? Remove the heat if your skin turns bright red. This is especially important if you are unable to feel pain, heat, or cold. You may have a greater risk of getting burned. Medicines  Take over-the-counter and prescription medicines only as told by your health care provider.  Do not drive or use heavy machinery while taking prescription pain medicine. Contact a health care provider if:  Your muscle pain gets worse and medicines do not help.  You have muscle pain that lasts longer than 3 days.  You have a rash or fever along with muscle pain.  You have muscle pain after a tick bite.  You have muscle pain while working out, even though you are in good physical condition.  You have redness, soreness, or swelling along with muscle pain.  You have muscle pain after starting a new medicine or changing the dose of a medicine. Get help right away if:  You have trouble breathing.  You have trouble swallowing.  You have muscle pain along with a stiff neck, fever, and vomiting.  You have severe muscle weakness or cannot move part of your body. This information is not intended to replace advice given to you by your health care provider. Make sure you discuss any questions you have with your health care provider. Document Released: 07/25/2006 Document Revised: 03/22/2016 Document Reviewed: 01/23/2016 Elsevier Interactive Patient Education  2018 [employer].

## 2018-07-29 ENCOUNTER — Other Ambulatory Visit: Payer: Self-pay | Admitting: Obstetrics & Gynecology

## 2018-08-25 ENCOUNTER — Other Ambulatory Visit: Payer: Self-pay | Admitting: Women's Health

## 2018-08-25 MED ORDER — GLIMEPIRIDE 4 MG PO TABS: 4.0000 mg | ORAL_TABLET | Freq: Two times a day (BID) | ORAL | 0 refills | Status: DC

## 2018-09-17 ENCOUNTER — Other Ambulatory Visit: Payer: Self-pay | Admitting: Women's Health

## 2018-12-14 ENCOUNTER — Telehealth: Payer: Self-pay | Admitting: *Deleted

## 2018-12-14 ENCOUNTER — Other Ambulatory Visit: Payer: Self-pay | Admitting: Women's Health

## 2018-12-14 NOTE — Telephone Encounter (Signed)
Rx sent via refill request.

## 2018-12-14 NOTE — Telephone Encounter (Signed)
It looks like Dr. Toney Rakes refilled that,  best if she sees an endocrinologist or her primary care we can refill it for 1 month but she needs to schedule to ensure best care for her diabetes.

## 2018-12-14 NOTE — Telephone Encounter (Signed)
Patient called requesting refill on glimepiride 4 mg tablet #60, annual scheduled on 12/29/18. Please advise

## 2018-12-28 ENCOUNTER — Other Ambulatory Visit: Payer: Self-pay

## 2018-12-29 ENCOUNTER — Encounter: Payer: Self-pay | Admitting: Women's Health

## 2018-12-29 ENCOUNTER — Ambulatory Visit: Payer: BC Managed Care – PPO | Admitting: Women's Health

## 2018-12-29 VITALS — BP 138/90 | Ht 63.25 in | Wt 187.0 lb

## 2018-12-29 DIAGNOSIS — Z13820 Encounter for screening for osteoporosis: Secondary | ICD-10-CM

## 2018-12-29 DIAGNOSIS — R7302 Impaired glucose tolerance (oral): Secondary | ICD-10-CM | POA: Diagnosis not present

## 2018-12-29 DIAGNOSIS — Z01419 Encounter for gynecological examination (general) (routine) without abnormal findings: Secondary | ICD-10-CM

## 2018-12-29 MED ORDER — IBUPROFEN 600 MG PO TABS: ORAL_TABLET | ORAL | 1 refills | Status: DC

## 2018-12-29 MED ORDER — GLIMEPIRIDE 4 MG PO TABS: 4.0000 mg | ORAL_TABLET | Freq: Two times a day (BID) | ORAL | 0 refills | Status: DC

## 2018-12-29 NOTE — Progress Notes (Signed)
Becky Decker 1960-10-22 [id number]    History:    Presents for annual exam.  Postmenopausal on no HRT with no bleeding.  Normal Pap and mammogram history, last mammogram 2016.  2017 1 benign colon polyp 5-year follow-up.  Has not had a DEXA.  Diabetes on Amaryl needs follow-up with primary care.  Rotator cuff surgery last year, continues to have hip, shoulder, headaches on occasion.  Past medical history, past surgical history, family history and social history were all reviewed and documented in the EPIC chart.  Homemaker.  2 children both doing well.  Helping to care for her mother (34) with recent breast cancer lumpectomy only  ROS:  A ROS was performed and pertinent positives and negatives are included.  Exam:  Vitals:   12/29/18 1145  BP: 138/90  Weight: 187 lb (84.8 kg)  Height: 5' 3.25" (1.607 m)   Body mass index is 32.86 kg/m.   General appearance:  Normal Thyroid:  Symmetrical, normal in size, without palpable masses or nodularity. Respiratory  Auscultation:  Clear without wheezing or rhonchi Cardiovascular  Auscultation:  Regular rate, without rubs, murmurs or gallops  Edema/varicosities:  Not grossly evident Abdominal  Soft,nontender, without masses, guarding or rebound.  Liver/spleen:  No organomegaly noted  Hernia:  None appreciated  Skin  Inspection:  Grossly normal   Breasts: Examined lying and sitting.     Right: Without masses, retractions, discharge or axillary adenopathy.     Left: Without masses, retractions, discharge or axillary adenopathy. Gentitourinary   Inguinal/mons:  Normal without inguinal adenopathy  External genitalia:  Normal  BUS/Urethra/Skene's glands:  Normal  Vagina: Atrophic   Cervix:  Normal  Uterus:  normal in size, shape and contour.  Midline and mobile  Adnexa/parametria:     Rt: Without masses or tenderness.   Lt: Without masses or tenderness.  Anus and perineum: Normal  Digital rectal exam: Normal sphincter tone  without palpated masses or tenderness  Assessment/Plan:  58 y.o. MBF G2 P2 for annual exam with no GYN complaints.  Postmenopausal/no HRT/no bleeding Diabetes on Amaryl Arthritis- hip/knee/shoulder pain Obesity  Plan: Refill of Amaryl 90-day supply, reviewed last time will refill, needs a primary care or endocrinologist, states does have a follow-up scheduled with primary care.  SBEs, overdue for mammogram, strongly encouraged to schedule, breast center information given.,  Instructed to schedule here.  Weightbearing and balance type exercise encouraged, yoga may also help with joint pain.  Encouraged vitamin D 2000 daily, decrease calorie/carbs.  Pap normal 2018, new screening guidelines reviewed.  Hemoglobin A1c only today will take to primary care office visit.    Huel [facility], 12:17 PM 12/29/2018

## 2018-12-29 NOTE — Patient Instructions (Signed)
[facility]ast center [phone]  shingrex  Vaccine for shingles prevention  YOGA may help joint pain  dexa   Health Maintenance for Postmenopausal Women Menopause is a normal process in which your reproductive ability comes to an end. This process happens gradually over a span of months to years, usually between the ages of 90 and 52. Menopause is complete when you have missed 12 consecutive menstrual periods. It is important to talk with your health care provider about some of the most common conditions that affect postmenopausal women, such as heart disease, cancer, and bone loss (osteoporosis). Adopting a healthy lifestyle and getting preventive care can help to promote your health and wellness. Those actions can also lower your chances of developing some of these common conditions. What should I know about menopause? During menopause, you may experience a number of symptoms, such as:  Moderate-to-severe hot flashes.  Night sweats.  Decrease in sex drive.  Mood swings.  Headaches.  Tiredness.  Irritability.  Memory problems.  Insomnia. Choosing to treat or not to treat menopausal changes is an individual decision that you make with your health care provider. What should I know about hormone replacement therapy and supplements? Hormone therapy products are effective for treating symptoms that are associated with menopause, such as hot flashes and night sweats. Hormone replacement carries certain risks, especially as you become older. If you are thinking about using estrogen or estrogen with progestin treatments, discuss the benefits and risks with your health care provider. What should I know about heart disease and stroke? Heart disease, heart attack, and stroke become more likely as you age. This may be due, in part, to the hormonal changes that your body experiences during menopause. These can affect how your body processes dietary fats, triglycerides, and cholesterol. Heart attack and  stroke are both medical emergencies. There are many things that you can do to help prevent heart disease and stroke:  Have your blood pressure checked at least every 1-2 years. High blood pressure causes heart disease and increases the risk of stroke.  If you are 74-53 years old, ask your health care provider if you should take aspirin to prevent a heart attack or a stroke.  Do not use any tobacco products, including cigarettes, chewing tobacco, or electronic cigarettes. If you need help quitting, ask your health care provider.  It is important to eat a healthy diet and maintain a healthy weight. ? Be sure to include plenty of vegetables, fruits, low-fat dairy products, and lean protein. ? Avoid eating foods that are high in solid fats, added sugars, or salt (sodium).  Get regular exercise. This is one of the most important things that you can do for your health. ? Try to exercise for at least 150 minutes each week. The type of exercise that you do should increase your heart rate and make you sweat. This is known as moderate-intensity exercise. ? Try to do strengthening exercises at least twice each week. Do these in addition to the moderate-intensity exercise.  Know your numbers.Ask your health care provider to check your cholesterol and your blood glucose. Continue to have your blood tested as directed by your health care provider.  What should I know about cancer screening? There are several types of cancer. Take the following steps to reduce your risk and to catch any cancer development as early as possible. Breast Cancer  Practice breast self-awareness. ? This means understanding how your breasts normally appear and feel. ? It also means doing regular breast  self-exams. Let your health care provider know about any changes, no matter how small.  If you are 30 or older, have a clinician do a breast exam (clinical breast exam or CBE) every year. Depending on your age, family history,  and medical history, it may be recommended that you also have a yearly breast X-ray (mammogram).  If you have a family history of breast cancer, talk with your health care provider about genetic screening.  If you are at high risk for breast cancer, talk with your health care provider about having an MRI and a mammogram every year.  Breast cancer (BRCA) gene test is recommended for women who have family members with BRCA-related cancers. Results of the assessment will determine the need for genetic counseling and BRCA1 and for BRCA2 testing. BRCA-related cancers include these types: ? Breast. This occurs in males or females. ? Ovarian. ? Tubal. This may also be called fallopian tube cancer. ? Cancer of the abdominal or pelvic lining (peritoneal cancer). ? Prostate. ? Pancreatic. Cervical, Uterine, and Ovarian Cancer Your health care provider may recommend that you be screened regularly for cancer of the pelvic organs. These include your ovaries, uterus, and vagina. This screening involves a pelvic exam, which includes checking for microscopic changes to the surface of your cervix (Pap test).  For women ages 21-65, health care providers may recommend a pelvic exam and a Pap test every three years. For women ages 69-65, they may recommend the Pap test and pelvic exam, combined with testing for human papilloma virus (HPV), every five years. Some types of HPV increase your risk of cervical cancer. Testing for HPV may also be done on women of any age who have unclear Pap test results.  Other health care providers may not recommend any screening for nonpregnant women who are considered low risk for pelvic cancer and have no symptoms. Ask your health care provider if a screening pelvic exam is right for you.  If you have had past treatment for cervical cancer or a condition that could lead to cancer, you need Pap tests and screening for cancer for at least 20 years after your treatment. If Pap tests  have been discontinued for you, your risk factors (such as having a new sexual partner) need to be reassessed to determine if you should start having screenings again. Some women have medical problems that increase the chance of getting cervical cancer. In these cases, your health care provider may recommend that you have screening and Pap tests more often.  If you have a family history of uterine cancer or ovarian cancer, talk with your health care provider about genetic screening.  If you have vaginal bleeding after reaching menopause, tell your health care provider.  There are currently no reliable tests available to screen for ovarian cancer. Lung Cancer Lung cancer screening is recommended for adults 15-63 years old who are at high risk for lung cancer because of a history of smoking. A yearly low-dose CT scan of the lungs is recommended if you:  Currently smoke.  Have a history of at least 30 pack-years of smoking and you currently smoke or have quit within the past 15 years. A pack-year is smoking an average of one pack of cigarettes per day for one year. Yearly screening should:  Continue until it has been 15 years since you quit.  Stop if you develop a health problem that would prevent you from having lung cancer treatment. Colorectal Cancer  This type of cancer can  be detected and can often be prevented.  Routine colorectal cancer screening usually begins at age 33 and continues through age 1.  If you have risk factors for colon cancer, your health care provider may recommend that you be screened at an earlier age.  If you have a family history of colorectal cancer, talk with your health care provider about genetic screening.  Your health care provider may also recommend using home test kits to check for hidden blood in your stool.  A small camera at the end of a tube can be used to examine your colon directly (sigmoidoscopy or colonoscopy). This is done to check for the  earliest forms of colorectal cancer.  Direct examination of the colon should be repeated every 5-10 years until age 39. However, if early forms of precancerous polyps or small growths are found or if you have a family history or genetic risk for colorectal cancer, you may need to be screened more often. Skin Cancer  Check your skin from head to toe regularly.  Monitor any moles. Be sure to tell your health care provider: ? About any new moles or changes in moles, especially if there is a change in a mole's shape or color. ? If you have a mole that is larger than the size of a pencil eraser.  If any of your family members has a history of skin cancer, especially at a Ra[place]illedge age, talk with your health care provider about genetic screening.  Always use sunscreen. Apply sunscreen liberally and repeatedly throughout the day.  Whenever you are outside, protect yourself by wearing long sleeves, pants, a wide-brimmed hat, and sunglasses. What should I know about osteoporosis? Osteoporosis is a condition in which bone destruction happens more quickly than new bone creation. After menopause, you may be at an increased risk for osteoporosis. To help prevent osteoporosis or the bone fractures that can happen because of osteoporosis, the following is recommended:  If you are 20-25 years old, get at least 1,000 mg of calcium and at least 600 mg of vitamin D per day.  If you are older than age 67 but younger than age 46, get at least 1,200 mg of calcium and at least 600 mg of vitamin D per day.  If you are older than age 53, get at least 1,200 mg of calcium and at least 800 mg of vitamin D per day. Smoking and excessive alcohol intake increase the risk of osteoporosis. Eat foods that are rich in calcium and vitamin D, and do weight-bearing exercises several times each week as directed by your health care provider. What should I know about how menopause affects my mental health? Depression may occur at any  age, but it is more common as you become older. Common symptoms of depression include:  Low or sad mood.  Changes in sleep patterns.  Changes in appetite or eating patterns.  Feeling an overall lack of motivation or enjoyment of activities that you previously enjoyed.  Frequent crying spells. Talk with your health care provider if you think that you are experiencing depression. What should I know about immunizations? It is important that you get and maintain your immunizations. These include:  Tetanus, diphtheria, and pertussis (Tdap) booster vaccine.  Influenza every year before the flu season begins.  Pneumonia vaccine.  Shingles vaccine. Your health care provider may also recommend other immunizations. This information is not intended to replace advice given to you by your health care provider. Make sure you discuss any questions  you have with your health care provider. Document Released: 10/25/2005 Document Revised: 03/22/2016 Document Reviewed: 06/06/2015 Elsevier Interactive Patient Education  2019 [employer].

## 2018-12-30 ENCOUNTER — Telehealth: Payer: Self-pay | Admitting: *Deleted

## 2018-12-30 DIAGNOSIS — R7309 Other abnormal glucose: Secondary | ICD-10-CM

## 2018-12-30 LAB — HEMOGLOBIN A1C
Hgb A1c MFr Bld: 12.7 % of total Hgb — ABNORMAL HIGH (ref <5.7)
Mean Plasma Glucose: 318 (calc)
eAG (mmol/L): 17.6 (calc)

## 2018-12-30 NOTE — Telephone Encounter (Signed)
-----   Message from Huel Cote, NP sent at 12/30/2018  8:18 AM EDT ----- Please call and review hemoglobin A1c is 12 should be under 7 for type 2 diabetes please refer her to a endocrinologist for better treatment.  Review this may help with headaches, blood sugar is much too high, and may cause other problems.  She does not like going to doctors but reviewe this is extremely important to get her diabetes under control to prevent other problems.

## 2018-12-30 NOTE — Telephone Encounter (Signed)
Patient informed, referral placed at [place] they will call to schedule. Number given to patient as well.

## 2019-01-01 ENCOUNTER — Other Ambulatory Visit: Payer: Self-pay | Admitting: Women's Health

## 2019-01-01 DIAGNOSIS — Z1231 Encounter for screening mammogram for malignant neoplasm of breast: Secondary | ICD-10-CM

## 2019-01-14 NOTE — Telephone Encounter (Signed)
Patient scheduled on 01/20/19 @ 1:00pm with Shamleffer, Melanie Crazier, MD

## 2019-01-20 ENCOUNTER — Ambulatory Visit: Payer: BC Managed Care – PPO | Admitting: Internal Medicine

## 2019-01-20 ENCOUNTER — Encounter: Payer: Self-pay | Admitting: Internal Medicine

## 2019-01-20 ENCOUNTER — Other Ambulatory Visit: Payer: Self-pay

## 2019-01-20 VITALS — BP 142/84 | HR 89 | Temp 97.8°F | Ht 63.0 in | Wt 188.0 lb

## 2019-01-20 DIAGNOSIS — E1165 Type 2 diabetes mellitus with hyperglycemia: Secondary | ICD-10-CM

## 2019-01-20 LAB — LIPID PANEL
Cholesterol: 265 mg/dL — ABNORMAL HIGH (ref 0–200)
HDL: 52.4 mg/dL (ref >39.00)
LDL Cholesterol: 188 mg/dL — ABNORMAL HIGH (ref 0–99)
NonHDL: 212.27
Total CHOL/HDL Ratio: 5
Triglycerides: 123 mg/dL (ref 0.0–149.0)
VLDL: 24.6 mg/dL (ref 0.0–40.0)

## 2019-01-20 LAB — BASIC METABOLIC PANEL
BUN: 18 mg/dL (ref 6–23)
CO2: 28 mEq/L (ref 19–32)
Calcium: 9.3 mg/dL (ref 8.4–10.5)
Chloride: 101 mEq/L (ref 96–112)
Creatinine, Ser: 0.66 mg/dL (ref 0.40–1.20)
GFR: 110.66 mL/min (ref >60.00)
Glucose, Bld: 219 mg/dL — ABNORMAL HIGH (ref 70–99)
Potassium: 3.4 mEq/L — ABNORMAL LOW (ref 3.5–5.1)
Sodium: 137 mEq/L (ref 135–145)

## 2019-01-20 LAB — MICROALBUMIN / CREATININE URINE RATIO
Creatinine,U: 95.8 mg/dL
Microalb Creat Ratio: 1.7 mg/g (ref 0.0–30.0)
Microalb, Ur: 1.6 mg/dL (ref 0.0–1.9)

## 2019-01-20 LAB — GLUCOSE, POCT (MANUAL RESULT ENTRY): POC Glucose: 268 mg/dl — AB (ref 70–99)

## 2019-01-20 MED ORDER — GLUCOSE BLOOD VI STRP: ORAL_STRIP | 12 refills | Status: DC

## 2019-01-20 MED ORDER — METFORMIN HCL ER 500 MG PO TB24: 500.0000 mg | ORAL_TABLET | Freq: Two times a day (BID) | ORAL | 6 refills | Status: DC

## 2019-01-20 MED ORDER — GLIPIZIDE 10 MG PO TABS: 10.0000 mg | ORAL_TABLET | Freq: Two times a day (BID) | ORAL | 3 refills | Status: DC

## 2019-01-20 NOTE — Progress Notes (Signed)
Name: Becky Decker  MRN/ DOB: [id number], [birth year]   Age/ Sex: 58 y.o., female    PCP: Huel Cote, NP   Reason for Endocrinology Evaluation: Type 2 Diabetes Mellitus     Date of Initial Endocrinology Visit: 01/20/2019     PATIENT IDENTIFIER: Becky Decker is a 58 y.o. female with a past medical history of dyslipidemia, and T2DM. The patient presented for initial endocrinology clinic visit on 01/20/2019 for consultative assistance with her diabetes management.    HPI: Becky Decker was    Diagnosed with DM in 2013 Prior Medications tried/Intolerance: Has been on Glimepiride since diagnosis . She was on Janumet at some point.  Currently checking blood sugars 0 x / day,  before breakfast. Hypoglycemia episodes : no     Hemoglobin A1c has ranged from 12.7 % in 2020, peaking at 13.8 in 2013. Patient required assistance for hypoglycemia: no Patient has required hospitalization within the last 1 year from hyper or hypoglycemia: no  In terms of diet, the patient drinks juice, eats 2 meals a day, does not snack as much   Self employed    HOME DIABETES REGIMEN: Amaryl 4 mg BID   Statin:NO ACE-I/ARB: No  Prior Diabetic Education: no   METER DOWNLOAD SUMMARY: did not bring    DIABETIC COMPLICATIONS: Microvascular complications:    Denies: CKD, retinopathy , neuropathy   Last eye exam: Completed yrs ago  Macrovascular complications:    Denies: CAD, PVD, CVA   PAST HISTORY: Past Medical History:  Past Medical History:  Diagnosis Date  . Asthma   . Diabetes mellitus    GESTATIONAL  . Diabetes mellitus type II   . OAB (overactive bladder)   . Overweight(278.02)    Past Surgical History:  Past Surgical History:  Procedure Laterality Date  . CESAREAN SECTION     X 2;  BILATERAL STERILIZATION PROCEDURE  . MOUTH SURGERY    . ROTATOR CUFF REPAIR Left       Social History:  reports that she has never smoked. She has never used smokeless  tobacco. She reports that she does not drink alcohol or use drugs. Family History:  Family History  Problem Relation Age of Onset  . Breast cancer Mother 81  . Diabetes Father   . Breast cancer Maternal Grandmother   . Colon cancer Neg Hx   . Colon polyps Neg Hx      HOME MEDICATIONS: Allergies as of 01/20/2019      Reactions   Milk-related Compounds Other (See Comments)   Lactose intolerance with milk      Medication List       Accurate as of Jan 20, 2019  1:17 PM. Always use your most recent med list.        glimepiride 4 MG tablet Commonly known as:  AMARYL Take 1 tablet (4 mg total) by mouth 2 (two) times daily.   ibuprofen 600 MG tablet Commonly known as:  ADVIL Take 4 time a day for 5 days with food and then prn        ALLERGIES: Allergies  Allergen Reactions  . Milk-Related Compounds Other (See Comments)    Lactose intolerance with milk     REVIEW OF SYSTEMS: A comprehensive ROS was conducted with the patient and is negative except as per HPI and below:  Review of Systems  Constitutional: Negative for fever.  HENT: Negative for congestion and sore throat.   Eyes: Positive for blurred vision. Negative for  pain.  Cardiovascular: Negative for chest pain and palpitations.  Gastrointestinal: Negative for diarrhea and nausea.  Genitourinary: Positive for frequency.  Neurological: Positive for headaches. Negative for tingling.  Endo/Heme/Allergies: Negative for polydipsia.  Psychiatric/Behavioral: Negative for depression. The patient is not nervous/anxious.       OBJECTIVE:   VITAL SIGNS: BP (!) 142/84 (BP Location: Right Arm, Patient Position: Sitting, Cuff Size: Normal)   Pulse 89   Temp 97.8 F (36.6 C)   Ht 5\' 3"  (1.6 m)   Wt 188 lb (85.3 kg)   LMP 03/13/2012   SpO2 98%   BMI 33.30 kg/m    PHYSICAL EXAM:  General: Pt appears well and is in NAD  Hydration: Well-hydrated with moist mucous membranes and good skin turgor  HEENT: Head:  Unremarkable with good dentition. Oropharynx clear without exudate.  Eyes: External eye exam normal without stare, lid lag or exophthalmos.  EOM intact.   Neck: General: Supple without adenopathy or carotid bruits. Thyroid: Thyroid size normal.  No goiter or nodules appreciated. No thyroid bruit.  Lungs: Clear with good BS bilat with no rales, rhonchi, or wheezes  Heart: RRR with normal S1 and S2 and no gallops; no murmurs; no rub  Abdomen: Normoactive bowel sounds, soft, nontender, without masses or organomegaly palpable  Extremities:  Lower extremities - No pretibial edema. No lesions.  Skin: Normal texture and temperature to palpation. No rash noted. No Acanthosis nigricans/skin tags. No lipohypertrophy.  Neuro: MS is good with appropriate affect, pt is alert and Ox3    DM foot exam: 01/20/2019 The skin of the feet is intact without sores or ulcerations. The pedal pulses are 2+ on right and 2+ on left. The sensation is decreased to the right  to a screening 5.07, 10 gram monofilament   DATA REVIEWED:  Lab Results  Component Value Date   HGBA1C 12.7 (H) 12/29/2018   HGBA1C 13.8 (H) 06/05/2012   Results for Becky Decker, Becky Decker (MRN [id number]) as of 01/21/2019 09:10  Ref. Range 01/20/2019 13:59  Sodium Latest Ref Range: 135 - 145 mEq/L 137  Potassium Latest Ref Range: 3.5 - 5.1 mEq/L 3.4 (L)  Chloride Latest Ref Range: 96 - 112 mEq/L 101  CO2 Latest Ref Range: 19 - 32 mEq/L 28  Glucose Latest Ref Range: 70 - 99 mg/dL 219 (H)  BUN Latest Ref Range: 6 - 23 mg/dL 18  Creatinine Latest Ref Range: 0.40 - 1.20 mg/dL 0.66  Calcium Latest Ref Range: 8.4 - 10.5 mg/dL 9.3  GFR Latest Ref Range: >60.00 mL/min 110.66  Total CHOL/HDL Ratio Unknown 5  Cholesterol Latest Ref Range: 0 - 200 mg/dL 265 (H)  HDL Cholesterol Latest Ref Range: >39.00 mg/dL 52.40  LDL (calc) Latest Ref Range: 0 - 99 mg/dL 188 (H)  MICROALB/CREAT RATIO Latest Ref Range: 0.0 - 30.0 mg/g 1.7  NonHDL Unknown 212.27   Triglycerides Latest Ref Range: 0.0 - 149.0 mg/dL 123.0  VLDL Latest Ref Range: 0.0 - 40.0 mg/dL 24.6   Results for Becky Decker, Becky Decker (MRN [id number]) as of 01/21/2019 09:10  Ref. Range 01/20/2019 13:59  Creatinine,U Latest Units: mg/dL 95.8  Microalb, Ur Latest Ref Range: 0.0 - 1.9 mg/dL 1.6  MICROALB/CREAT RATIO Latest Ref Range: 0.0 - 30.0 mg/g 1.7         ASSESSMENT / PLAN / RECOMMENDATIONS:   1) Type 2 Diabetes Mellitus, Poorly controlled with neuropathic complications  - Most recent A1c of 12.7 %. Goal A1c < 7.0%.   Plan: GENERAL: I have  discussed with the patient the pathophysiology of diabetes. We went over the natural progression of the disease. We talked about both insulin resistance and insulin deficiency. We stressed the importance of lifestyle changes including diet and exercise. I explained the complications associated with diabetes including retinopathy, nephropathy, neuropathy as well as increased risk of cardiovascular disease. We went over the benefit seen with glycemic control.    I explained to the patient that diabetic patients are at higher than normal risk for amputations.   Discussed the importance of glucose check and the availability of this data to make management decisions.   I have explained to her that insulin is the most effective way of bringing her glucose down at this time, as most likely she is glucose toxic. Pt declined insulin, despite my reassurance that this is transient until her glucose is under better control  Pt would like to try oral glycemic agents  She is not sure she could get herself to check finger sticks, she was taught today on glucose meter use by [employer].   MEDICATIONS:  Stop Glimepiride   Start Glipizide 10 mg BID with meals  Start Metformin 500 mg XR 1 tab BID   EDUCATION / INSTRUCTIONS:  BG monitoring instructions: Patient is instructed to check her blood sugars 2 times a day, fasting and supper.  Call [place]  Endocrinology clinic if: BG persistently < 70 or > 300. . I reviewed the Rule of 15 for the treatment of hypoglycemia in detail with the patient. Literature supplied.   2) Diabetic complications:   Eye: Unknown to  have known diabetic retinopathy. She was urged to have an eye exam annually  Neuro/ Feet: Does have known diabetic peripheral neuropathy, based on decrease sensation on the right   Renal: Patient does not have known baseline CKD. She is not on an ACEI/ARB at present. No evidence of nephropathy.   3) Lipids: Patient is not on a statin. I have discussed the ADA guidelines in lipid treatment and the benefit of statins in reducing risk of CAD and CVD. Pt declined lipid lowering agents at this time.   4) Hypertension: Borderline elevation. I have encouraged her to establish care with a PCP.    F/u in 6 weeks.    Signed electronically by: Mack Guise, MD  [facility]  [facility] [address] [place], [place] [address] Phone: [phone] FAX: [phone]   CC: Huel Cote, NP [address] [place] [address] Phone: [phone]  Fax: [phone]    Return to Endocrinology clinic as below: Future Appointments  Date Time Provider [place]  03/09/2019 10:00 AM [facility] 2 [facility]

## 2019-01-20 NOTE — Patient Instructions (Addendum)
-   STOP Glimepiride  - Start Glipizide 10 mg Twice a day with meals (Breakfast and Supper -  Start Metformin 1 tablet daily with Breakfast for 1 week, then increase to 1 tablet with Breakfast and 1 tablet with Supper daily .   - Check sugar twice a day (fasting and supper time) when possible   - HOW TO TREAT LOW BLOOD SUGARS (Blood sugar LESS THAN 70 MG/DL)  Please follow the RULE OF 15 for the treatment of hypoglycemia treatment (when your (blood sugars are less than 70 mg/dL)    STEP 1: Take 15 grams of carbohydrates when your blood sugar is low, which includes:   3-4 GLUCOSE TABS  OR  3-4 OZ OF JUICE OR REGULAR SODA OR  ONE TUBE OF GLUCOSE GEL     STEP 2: RECHECK blood sugar in 15 MINUTES STEP 3: If your blood sugar is still low at the 15 minute recheck --> then, go back to STEP 1 and treat AGAIN with another 15 grams of carbohydrates.

## 2019-01-21 ENCOUNTER — Encounter: Payer: Self-pay | Admitting: Internal Medicine

## 2019-01-26 ENCOUNTER — Telehealth: Payer: Self-pay | Admitting: *Deleted

## 2019-01-26 DIAGNOSIS — N644 Mastodynia: Secondary | ICD-10-CM

## 2019-01-26 NOTE — Telephone Encounter (Signed)
-----   Message from Huel Cote, NP sent at 01/26/2019  8:59 AM EDT ----- Telephone call for follow-up from seeing endocrinologist and is on different medications not tolerating well strongly encouraged to decrease dose not quit and then gradually increase keep scheduled follow-up.  Continue to work on [employer], daily walking.   States has continued with bilateral breast tenderness with tingling sensation intermittent for the past 2 to 3 months, normal exam at annual exam 12/29/2018 please schedule bilateral diagnostic mammogram.  Has annual scheduled June 23 does not want to wait.

## 2019-01-26 NOTE — Telephone Encounter (Signed)
Patient scheduled at breast center 02/15/19 @ 2:45pm left message for pt to call.

## 2019-02-02 NOTE — Telephone Encounter (Signed)
Patient aware of time and date

## 2019-02-15 ENCOUNTER — Other Ambulatory Visit: Payer: Self-pay

## 2019-02-15 ENCOUNTER — Ambulatory Visit: Payer: BC Managed Care – PPO

## 2019-02-15 ENCOUNTER — Ambulatory Visit
Admission: RE | Admit: 2019-02-15 | Discharge: 2019-02-15 | Disposition: A | Discharge status: Home / Self Care | Payer: BC Managed Care – PPO | Source: Ambulatory Visit | Attending: Women's Health | Admitting: Women's Health

## 2019-02-15 DIAGNOSIS — N644 Mastodynia: Secondary | ICD-10-CM

## 2019-03-03 ENCOUNTER — Ambulatory Visit: Payer: BC Managed Care – PPO | Admitting: Internal Medicine

## 2019-03-09 ENCOUNTER — Other Ambulatory Visit: Payer: Self-pay | Admitting: Women's Health

## 2019-03-09 ENCOUNTER — Ambulatory Visit: Payer: BC Managed Care – PPO

## 2019-03-11 ENCOUNTER — Other Ambulatory Visit: Payer: Self-pay | Admitting: Women's Health

## 2019-03-11 NOTE — Telephone Encounter (Signed)
Patient said she has some and did not request refill. I read her [place]'s note anyway and she voiced understanding. She will leave this at pharmacy and pick up when she needs it.

## 2019-03-11 NOTE — Telephone Encounter (Signed)
[place] for refill, let her know not good to take to often, will quite working and hard on the kidneys

## 2019-03-16 ENCOUNTER — Ambulatory Visit: Payer: BC Managed Care – PPO | Admitting: Internal Medicine

## 2019-03-22 ENCOUNTER — Other Ambulatory Visit: Payer: Self-pay

## 2019-03-23 ENCOUNTER — Other Ambulatory Visit: Payer: Self-pay | Admitting: Women's Health

## 2019-03-23 ENCOUNTER — Ambulatory Visit (INDEPENDENT_AMBULATORY_CARE_PROVIDER_SITE_OTHER): Payer: BC Managed Care – PPO

## 2019-03-23 DIAGNOSIS — Z13820 Encounter for screening for osteoporosis: Secondary | ICD-10-CM

## 2019-03-23 DIAGNOSIS — Z780 Asymptomatic menopausal state: Secondary | ICD-10-CM

## 2019-03-24 ENCOUNTER — Encounter: Payer: Self-pay | Admitting: Gynecology

## 2019-04-15 ENCOUNTER — Other Ambulatory Visit: Payer: Self-pay | Admitting: Internal Medicine

## 2019-06-15 ENCOUNTER — Encounter: Payer: Self-pay | Admitting: Gynecology

## 2019-06-18 ENCOUNTER — Telehealth: Payer: Self-pay | Admitting: Internal Medicine

## 2019-06-18 MED ORDER — METFORMIN HCL 500 MG PO TABS: 500.0000 mg | ORAL_TABLET | Freq: Two times a day (BID) | ORAL | 3 refills | Status: DC

## 2019-06-18 NOTE — Telephone Encounter (Signed)
Patient requests to be called asap at ph# [phone] to discuss medications/Metformin. Patient received a letter from her Pharmacy.

## 2019-06-18 NOTE — Telephone Encounter (Signed)
Explained to pt that you sent a new rx to her pharmacy, pt was still upset because she felt that it is her doctor's responsibility to contact her of a recall.

## 2019-06-18 NOTE — Telephone Encounter (Signed)
Pt was upset because she received a letter for the pharmacy about a recall on metformin, pt thought that our office was responsible for contacting her, I attempted to explain that only certain lot #'s were under the recall and only the pharmacy would know if she received any medication under that lot. I asked the pt to contact her pharmacy and that is she has any of that medication then to call office back

## 2019-09-17 HISTORY — PX: ROTATOR CUFF REPAIR: SHX139

## 2019-12-09 ENCOUNTER — Ambulatory Visit: Payer: BC Managed Care – PPO | Attending: Internal Medicine

## 2019-12-09 DIAGNOSIS — Z23 Encounter for immunization: Secondary | ICD-10-CM

## 2019-12-09 NOTE — Progress Notes (Signed)
   Covid-19 Vaccination Clinic  Name:  Anaiza Azbill    MRN: [id number] DOB: Nov 11, 1960  12/09/2019  Ms. Addeo was observed post Covid-19 immunization for 15 minutes without incident. She was provided with Vaccine Information Sheet and instruction to access the V-Safe system.   Ms. Bullinger was instructed to call 911 with any severe reactions post vaccine: Marland Kitchen Difficulty breathing  . Swelling of face and throat  . A fast heartbeat  . A bad rash all over body  . Dizziness and weakness   Immunizations Administered    Name Date Dose VIS Date Route   Pfizer COVID-19 Vaccine 12/09/2019  1:56 PM 0.3 mL 08/27/2019 Intramuscular   Manufacturer: [place]   Lot: [id number]   [place]: [id number]

## 2020-01-03 ENCOUNTER — Ambulatory Visit: Payer: BC Managed Care – PPO | Attending: Internal Medicine

## 2020-01-03 DIAGNOSIS — Z23 Encounter for immunization: Secondary | ICD-10-CM

## 2020-01-03 NOTE — Progress Notes (Signed)
   Covid-19 Vaccination Clinic  Name:  Becky Decker    MRN: [id number] DOB: 1961-09-15  01/03/2020  [name] was observed post Covid-19 immunization for 15 minutes without incident. She was provided with Vaccine Information Sheet and instruction to access the V-Safe system.   [name] was instructed to call 911 with any severe reactions post vaccine: Marland Kitchen Difficulty breathing  . Swelling of face and throat  . A fast heartbeat  . A bad rash all over body  . Dizziness and weakness   Immunizations Administered    Name Date Dose VIS Date Route   Pfizer COVID-19 Vaccine 01/03/2020 11:59 AM 0.3 mL 11/10/2018 Intramuscular   Manufacturer: [place]   Lot: [id number]   [place]: [id number]

## 2020-04-12 ENCOUNTER — Ambulatory Visit (INDEPENDENT_AMBULATORY_CARE_PROVIDER_SITE_OTHER): Payer: BC Managed Care – PPO | Admitting: Internal Medicine

## 2020-04-12 ENCOUNTER — Other Ambulatory Visit: Payer: Self-pay

## 2020-04-12 VITALS — BP 162/88 | HR 96 | Ht 63.0 in | Wt 186.8 lb

## 2020-04-12 DIAGNOSIS — E1165 Type 2 diabetes mellitus with hyperglycemia: Secondary | ICD-10-CM

## 2020-04-12 DIAGNOSIS — E785 Hyperlipidemia, unspecified: Secondary | ICD-10-CM | POA: Diagnosis not present

## 2020-04-12 LAB — POCT GLYCOSYLATED HEMOGLOBIN (HGB A1C): Hemoglobin A1C: 11 % — AB (ref 4.0–5.6)

## 2020-04-12 MED ORDER — GLIMEPIRIDE 4 MG PO TABS: 8.0000 mg | ORAL_TABLET | Freq: Every day | ORAL | 1 refills | Status: DC

## 2020-04-12 MED ORDER — METFORMIN HCL 500 MG PO TABS: 1000.0000 mg | ORAL_TABLET | Freq: Two times a day (BID) | ORAL | 1 refills | Status: DC

## 2020-04-12 NOTE — Progress Notes (Signed)
Name: Becky Decker  Age/ Sex: 59 y.o., female   MRN/ DOB: [id number], May 24, 1961     PCP: Huel Cote, NP   Reason for Endocrinology Evaluation: Type 2 Diabetes Mellitus  Initial Endocrine Consultative Visit: 01/20/2019    PATIENT IDENTIFIER: [name] is a 59 y.o. female with a past medical history of T2Dm and Dyslipidemia. The patient has followed with Endocrinology clinic since 01/20/2019 for consultative assistance with management of her diabetes.  DIABETIC HISTORY:  Ms. Bouse was diagnosed with DM in 2013.Has been on Glimepiride since diagnosis. Was on Janumet in the past. Her hemoglobin A1c has ranged from 12.7 % in 2020, peaking at 13.8 in 2013.  On her initial visit to our clinic she had an A1c of 12.7% . She was on Glimepiride only. She declined insulin . We switched Glimepiride to Glipizide and added metformin  SUBJECTIVE:   During the last visit (01/2019): A1c 12.7% . Pt declined insulin. We switched Glimepiride to Glipizide and added metformin    Today (04/12/2020): Ms. Solberg is here for a follow up on diabetes management, she is accompanied by her son today. She has NOT been to our clinic in 14 months.  She checks her blood sugars 0 times daily. The patient did not bring a meter today.   She is planning on having right shoulder surgery.     HOME DIABETES REGIMEN:  Glipizide 10 mg BID with meals - self discontinued  Glimepiride 4 mg, 1 tablet BID- 1 tablet daily  Metformin 500 mg XR 1 tab BID - 1 tablet daily     Statin: no   ACE-I/ARB: no   METER DOWNLOAD SUMMARY: Did not bring    DIABETIC COMPLICATIONS: Microvascular complications:    Denies: retinopathy, neuropathy, CKD  Last Eye Exam: Completed yrs ago  Macrovascular complications:    Denies: CAD, CVA, PVD   HISTORY:  Past Medical History:  Past Medical History:  Diagnosis Date  . Asthma   . Diabetes mellitus    GESTATIONAL  . Diabetes mellitus type II    . OAB (overactive bladder)   . Overweight(278.02)    Past Surgical History:  Past Surgical History:  Procedure Laterality Date  . CESAREAN SECTION     X 2;  BILATERAL STERILIZATION PROCEDURE  . MOUTH SURGERY    . ROTATOR CUFF REPAIR Left     Social History:  reports that she has never smoked. She has never used smokeless tobacco. She reports that she does not drink alcohol and does not use drugs. Family History:  Family History  Problem Relation Age of Onset  . Breast cancer Mother 30  . Diabetes Father   . Breast cancer Maternal Grandmother   . Colon cancer Neg Hx   . Colon polyps Neg Hx      HOME MEDICATIONS: Allergies as of 04/12/2020      Reactions   Milk-related Compounds Other (See Comments)   Lactose intolerance with milk      Medication List       Accurate as of April 12, 2020  3:03 PM. If you have any questions, ask your nurse or doctor.        STOP taking these medications   glipiZIDE 10 MG tablet Commonly known as: GLUCOTROL Stopped by: Dorita Sciara, MD     TAKE these medications   glimepiride 4 MG tablet Commonly known as: AMARYL Take 2 tablets (8 mg total) by mouth daily with breakfast. What changed: See the  new instructions. Changed by: Dorita Sciara, MD   glucose blood test strip Commonly known as: OneTouch Verio Twice daily   ibuprofen 600 MG tablet Commonly known as: ADVIL TAKE 1 TABLET 4 TIME A DAY FOR 5 DAYS WITH FOOD AND THEN AS NEEDED   metFORMIN 500 MG tablet Commonly known as: GLUCOPHAGE Take 2 tablets (1,000 mg total) by mouth 2 (two) times daily with a meal. What changed: how much to take Changed by: Dorita Sciara, MD        OBJECTIVE:   Vital Signs: BP (!) 162/88 (BP Location: Right Arm, Patient Position: Sitting, Cuff Size: Normal)   Pulse 96   Ht 5\' 3"  (1.6 m)   Wt 186 lb 12.8 oz (84.7 kg)   LMP 03/13/2012   SpO2 98%   BMI 33.09 kg/m   Wt Readings from Last 3 Encounters:  04/12/20 186 lb  12.8 oz (84.7 kg)  01/20/19 188 lb (85.3 kg)  12/29/18 187 lb (84.8 kg)     Exam: General: Pt appears well and is in NAD  Neck: General: Supple without adenopathy. Thyroid: Thyroid size normal.  No goiter or nodules appreciated.  Lungs: Clear with good BS bilat with no rales, rhonchi, or wheezes  Heart: RRR with normal S1 and S2 and no gallops; no murmurs; no rub  Abdomen: Normoactive bowel sounds, soft, nontender, without masses or organomegaly palpable  Extremities: No pretibial edema.   Neuro: MS is good with appropriate affect, pt is alert and Ox3    DM foot exam: 04/12/2020    The skin of the feet is intact without sores or ulcerations. The pedal pulses are 1+ on right and 1+ on left. The sensation is intact to a screening 5.07, 10 gram monofilament bilaterally          DATA REVIEWED:  Lab Results  Component Value Date   HGBA1C 11.0 (A) 04/12/2020   HGBA1C 12.7 (H) 12/29/2018   HGBA1C 13.8 (H) 06/05/2012   Lab Results  Component Value Date   MICROALBUR 1.6 01/20/2019   LDLCALC 188 (H) 01/20/2019   CREATININE 0.66 01/20/2019   Lab Results  Component Value Date   MICRALBCREAT 1.7 01/20/2019     Lab Results  Component Value Date   CHOL 265 (H) 01/20/2019   HDL 52.40 01/20/2019   LDLCALC 188 (H) 01/20/2019   TRIG 123.0 01/20/2019   CHOLHDL 5 01/20/2019         ASSESSMENT / PLAN / RECOMMENDATIONS:   1) Type 2 Diabetes Mellitus, Poorly controlled, Without complications - Most recent A1c of 11.0 %. Goal A1c < 7.0 %.     - Poorly controlled diabetes due to medication non-adherence and dietary indiscretions.  - She had declined insulin on her last visit over a year ago, and again today. She had declined CDE referral.  - She has been taking 50% less medication than prescribed -I have discussed with the patient the pathophysiology of diabetes. We went over the natural progression of the disease. We talked about both insulin resistance and insulin deficiency.  We stressed the importance of lifestyle changes including diet and exercise. I explained the complications associated with diabetes including retinopathy, nephropathy, neuropathy as well as increased risk of cardiovascular disease. We went over the benefit seen with glycemic control.   I explained to the patient that diabetic patients are at higher than normal risk for amputations.  - Will adjust her medications below, pt understands that er A1c has to be less than  7.5% to get cleared for shoulder surgery, and there's no guarantee she will be there with oral glycemic agents alone.   MEDICATIONS:  Increase Glimepiride 4 mg, 2 tabs before Breakfast   Increase Metformin 500 mg, 2 tabs before breakfast and 2 tabs before supper     EDUCATION / INSTRUCTIONS:  BG monitoring instructions: Patient is instructed to check her blood sugars 1 times a day, fasting.  Call [facility] if: BG persistently < 70 or > 180 . I reviewed the Rule of 15 for the treatment of hypoglycemia in detail with the patient. Literature supplied.    2) Diabetic complications:   Eye: Unknown to  have known diabetic retinopathy.   Neuro/ Feet: Does not have known diabetic peripheral neuropathy  Renal: Patient does not have known baseline CKD. She is not on an ACEI/ARB at present    3) Dyslipidemia: She had declined statin therapy in the past  And again today. We discussed cardiovascular benefits and her increased risk of strokes and heart attacks.    F/U in 6 weeks    Signed electronically by: Mack Guise, MD  [facility]  [facility] [address], [place] [address] Phone: [phone] FAX: [phone]   CC: Huel Cote, NP No address on file Phone: None  Fax: None  Return to Endocrinology clinic as below: No future appointments.

## 2020-04-12 NOTE — Patient Instructions (Addendum)
-   Increase Glimepiride 4 mg, 2 tablets before Breakfast  - Increase Metformin 500 mg, 1 tablet with Breakfast and 1 tablet with supper for a week, then increase  to 2 tablet with Breakfast and 1 tablet with supper for another week, then increase to 2 tablet with Breakfast and 2 tablet with supper if no side effects.     - Brisk walking 150 minutes per week    - If your sugars are consistently over 180 mg/dL contact us     -Choose healthy, lower carb lower calorie snacks: toss salad,  vegetables,  peanut butter, low fat cheese / string cheese, lower sodium deli meat, tuna salad or chicken salad     HOW TO TREAT LOW BLOOD SUGARS (Blood sugar LESS THAN 70 MG/DL)  Please follow the RULE OF 15 for the treatment of hypoglycemia treatment (when your (blood sugars are less than 70 mg/dL)    STEP 1: Take 15 grams of carbohydrates when your blood sugar is low, which includes:   3-4 GLUCOSE TABS  OR  3-4 OZ OF JUICE OR REGULAR SODA OR  ONE TUBE OF GLUCOSE GEL     STEP 2: RECHECK blood sugar in 15 MINUTES STEP 3: If your blood sugar is still low at the 15 minute recheck --> then, go back to STEP 1 and treat AGAIN with another 15 grams of carbohydrates.

## 2020-05-19 ENCOUNTER — Telehealth: Payer: Self-pay | Admitting: Internal Medicine

## 2020-05-19 ENCOUNTER — Other Ambulatory Visit: Payer: Self-pay

## 2020-05-19 MED ORDER — ONETOUCH VERIO VI STRP: ORAL_STRIP | 12 refills | Status: AC

## 2020-05-19 NOTE — Telephone Encounter (Signed)
Rx sent 

## 2020-05-19 NOTE — Telephone Encounter (Signed)
Medication Refill Request  Did you call your pharmacy and request this refill first? Yes    If patient has not contacted pharmacy first, instruct them to do so for future refills.   Remind them that contacting the pharmacy for their refill is the quickest method to get the refill.   Refill policy also stated that it will take anywhere between 24-72 hours to receive the refill.    Name of medication? Test strips  Is this a 90 day supply?   Name and location of pharmacy?  [id number] [place], [place] - [address] AT [place] Phone:  [phone]  Fax:  [phone]        Is the request for diabetes test strips? yes  If yes, what brand? One touch verio

## 2020-06-11 NOTE — Progress Notes (Signed)
Name: Becky Decker  Age/ Sex: 59 y.o., female   MRN/ DOB: [id number], August 21, 1961     PCP: Becky Cote, NP (Inactive)   Reason for Endocrinology Evaluation: Type 2 Diabetes Mellitus  Initial Endocrine Consultative Visit: 01/20/2019    PATIENT IDENTIFIER: [name] is a 59 y.o. female with a past medical history of T2Dm and Dyslipidemia. The patient has followed with Endocrinology clinic since 01/20/2019 for consultative assistance with management of her diabetes.  DIABETIC HISTORY:  [name] was diagnosed with DM in 2013.Has been on Glimepiride since diagnosis. Was on Janumet in the past. Her hemoglobin A1c has ranged from 12.7 % in 2020, peaking at 13.8 in 2013.  On her initial visit to our clinic she had an A1c of 12.7% . She was on Glimepiride only. She declined insulin . We switched Glimepiride to Glipizide and added metformin but she self discontinued Glipizide.  SUBJECTIVE:   During the last visit (04/12/2020): A1c 11.0% . Pt declined insulin. We increased Glimepiride and Metformin    Today (06/12/2020): [name] is here for a follow up on diabetes management.  She checks her blood sugars 1 times daily. The patient did not bring a meter today. Denies hypoglycemia   She is planning on having right shoulder surgery. ( Dr. Theda Sers- [facility])   Denies nausea or vomiting    HOME DIABETES REGIMEN:  Glimepiride 4 mg, 2 tabs daily  Metformin 500 mg XR 2 tab BID- 1 tab BID     Statin: no   ACE-I/ARB: no   METER DOWNLOAD SUMMARY: Did not bring  83-180 mg/dL   DIABETIC COMPLICATIONS: Microvascular complications:    Denies: retinopathy, neuropathy, CKD  Last Eye Exam: Completed yrs ago  Macrovascular complications:    Denies: CAD, CVA, PVD   HISTORY:  Past Medical History:  Past Medical History:  Diagnosis Date  . Asthma   . Diabetes mellitus    GESTATIONAL  . Diabetes mellitus type II   . OAB (overactive bladder)     . Overweight(278.02)    Past Surgical History:  Past Surgical History:  Procedure Laterality Date  . CESAREAN SECTION     X 2;  BILATERAL STERILIZATION PROCEDURE  . MOUTH SURGERY    . ROTATOR CUFF REPAIR Left     Social History:  reports that she has never smoked. She has never used smokeless tobacco. She reports that she does not drink alcohol and does not use drugs. Family History:  Family History  Problem Relation Age of Onset  . Breast cancer Mother 14  . Diabetes Father   . Breast cancer Maternal Grandmother   . Colon cancer Neg Hx   . Colon polyps Neg Hx      HOME MEDICATIONS: Allergies as of 06/12/2020      Reactions   Milk-related Compounds Other (See Comments)   Lactose intolerance with milk      Medication List       Accurate as of June 12, 2020 10:25 AM. If you have any questions, ask your nurse or doctor.        glimepiride 4 MG tablet Commonly known as: AMARYL Take 2 tablets (8 mg total) by mouth daily with breakfast.   ibuprofen 600 MG tablet Commonly known as: ADVIL TAKE 1 TABLET 4 TIME A DAY FOR 5 DAYS WITH FOOD AND THEN AS NEEDED   metFORMIN 500 MG tablet Commonly known as: GLUCOPHAGE Take 2 tablets (1,000 mg total) by mouth 2 (two) times  daily with a meal.   OneTouch Verio test strip Generic drug: glucose blood Use Onetouch Verio test strips to check blood sugar once daily        OBJECTIVE:   Vital Signs: BP 138/74 (BP Location: Left Arm, Patient Position: Sitting, Cuff Size: Normal)   Pulse 97   Ht 5\' 3"  (1.6 m)   Wt 192 lb (87.1 kg)   LMP 03/13/2012   SpO2 98%   BMI 34.01 kg/m   Wt Readings from Last 3 Encounters:  06/12/20 192 lb (87.1 kg)  04/12/20 186 lb 12.8 oz (84.7 kg)  01/20/19 188 lb (85.3 kg)     Exam: General: Pt appears well and is in NAD  Neck: General: Supple without adenopathy. Thyroid: Thyroid size normal.  No goiter or nodules appreciated.  Lungs: Clear with good BS bilat with no rales, rhonchi, or  wheezes  Heart: RRR with normal S1 and S2 and no gallops; no murmurs; no rub  Abdomen: Normoactive bowel sounds, soft, nontender, without masses or organomegaly palpable  Extremities: No pretibial edema.   Neuro: MS is good with appropriate affect, pt is alert and Ox3    DM foot exam: 04/12/2020    The skin of the feet is intact without sores or ulcerations. The pedal pulses are 1+ on right and 1+ on left. The sensation is intact to a screening 5.07, 10 gram monofilament bilaterally          DATA REVIEWED:  Lab Results  Component Value Date   HGBA1C 11.0 (A) 04/12/2020   HGBA1C 12.7 (H) 12/29/2018   HGBA1C 13.8 (H) 06/05/2012   Lab Results  Component Value Date   MICROALBUR 1.6 01/20/2019   LDLCALC 188 (H) 01/20/2019   CREATININE 0.66 01/20/2019   Lab Results  Component Value Date   MICRALBCREAT 1.7 01/20/2019     Lab Results  Component Value Date   CHOL 265 (H) 01/20/2019   HDL 52.40 01/20/2019   LDLCALC 188 (H) 01/20/2019   TRIG 123.0 01/20/2019   CHOLHDL 5 01/20/2019       BG 141 mg/dL   ASSESSMENT / PLAN / RECOMMENDATIONS:   1) Type 2 Diabetes Mellitus, with improving glycemic control, Without complications - Most recent A1c of 7.7 %. Goal A1c < 7.0 %.     - A1c down from 11.0% with in just 2 months, I have praised the pt on lifestyle changes and medication compliance.  - She is not having any hypoglycemic episodes, I have advised her to increase metformin with a long term goal of reducing glimepiride.    MEDICATIONS:  Continue  Glimepiride 4 mg, 2 tabs before Breakfast   Increase Metformin 500 mg, 2 tabs before breakfast and 2 tabs before supper    EDUCATION / INSTRUCTIONS:  BG monitoring instructions: Patient is instructed to check her blood sugars 1 times a day, fasting. . Call [facility] if: BG persistently < 70  . I reviewed the Rule of 15 for the treatment of hypoglycemia in detail with the patient. Literature  supplied.    2) Diabetic complications:   Eye: Unknown to  have known diabetic retinopathy. Pt urged to have an eye exam   Neuro/ Feet: Does not have known diabetic peripheral neuropathy  Renal: Patient does not have known baseline CKD. She is not on an ACEI/ARB at present    3) Dyslipidemia: She had declined statin therapy in the past . We discussed cardiovascular benefits and her increased risk of strokes and  heart attacks.    F/U in 4 months    Signed electronically by: Mack Guise, MD  [facility]  [facility] [address] [place], [place] [address] Phone: [phone] FAX: [phone]   CC: Becky Cote, NP (Inactive) No address on file Phone: None  Fax: None  Return to Endocrinology clinic as below: No future appointments.

## 2020-06-12 ENCOUNTER — Ambulatory Visit (INDEPENDENT_AMBULATORY_CARE_PROVIDER_SITE_OTHER): Payer: BC Managed Care – PPO | Admitting: Internal Medicine

## 2020-06-12 ENCOUNTER — Other Ambulatory Visit: Payer: Self-pay

## 2020-06-12 ENCOUNTER — Encounter: Payer: Self-pay | Admitting: Internal Medicine

## 2020-06-12 VITALS — BP 138/74 | HR 97 | Ht 63.0 in | Wt 192.0 lb

## 2020-06-12 DIAGNOSIS — E1165 Type 2 diabetes mellitus with hyperglycemia: Secondary | ICD-10-CM

## 2020-06-12 LAB — POCT GLYCOSYLATED HEMOGLOBIN (HGB A1C): Hemoglobin A1C: 7.7 % — AB (ref 4.0–5.6)

## 2020-06-12 LAB — POCT GLUCOSE (DEVICE FOR HOME USE): Glucose Fasting, POC: 141 mg/dL — AB (ref 70–99)

## 2020-06-12 NOTE — Patient Instructions (Signed)
-   You have done an AMAZING Work !!! Keep it up - Continue  Glimepiride 4 mg, 2 tablets before Breakfast  - Increase Metformin 500 mg, 2 tablets with Breakfast and 2 tablets with supper     HOW TO TREAT LOW BLOOD SUGARS (Blood sugar LESS THAN 70 MG/DL)  Please follow the RULE OF 15 for the treatment of hypoglycemia treatment (when your (blood sugars are less than 70 mg/dL)    STEP 1: Take 15 grams of carbohydrates when your blood sugar is low, which includes:   3-4 GLUCOSE TABS  OR  3-4 OZ OF JUICE OR REGULAR SODA OR  ONE TUBE OF GLUCOSE GEL     STEP 2: RECHECK blood sugar in 15 MINUTES STEP 3: If your blood sugar is still low at the 15 minute recheck --> then, go back to STEP 1 and treat AGAIN with another 15 grams of carbohydrates.

## 2020-09-26 ENCOUNTER — Other Ambulatory Visit: Payer: Self-pay | Admitting: Internal Medicine

## 2020-11-02 ENCOUNTER — Other Ambulatory Visit: Payer: Self-pay | Admitting: Internal Medicine

## 2021-01-02 ENCOUNTER — Encounter: Payer: Self-pay | Admitting: Nurse Practitioner

## 2021-01-02 ENCOUNTER — Other Ambulatory Visit: Payer: Self-pay

## 2021-01-02 ENCOUNTER — Ambulatory Visit (INDEPENDENT_AMBULATORY_CARE_PROVIDER_SITE_OTHER): Payer: BC Managed Care – PPO | Admitting: Nurse Practitioner

## 2021-01-02 VITALS — BP 124/82 | Ht 64.0 in | Wt 199.0 lb

## 2021-01-02 DIAGNOSIS — G8929 Other chronic pain: Secondary | ICD-10-CM | POA: Diagnosis not present

## 2021-01-02 DIAGNOSIS — Z01419 Encounter for gynecological examination (general) (routine) without abnormal findings: Secondary | ICD-10-CM

## 2021-01-02 DIAGNOSIS — L989 Disorder of the skin and subcutaneous tissue, unspecified: Secondary | ICD-10-CM

## 2021-01-02 DIAGNOSIS — M545 Low back pain, unspecified: Secondary | ICD-10-CM | POA: Diagnosis not present

## 2021-01-02 NOTE — Progress Notes (Signed)
Becky Decker October 30, 1960 [id number]   History:  60 y.o. G2P2002 presents for annual exam. She complains of left lower back pain that started last fall. The pain is intermittent, crampy, and hurts when taking a breath. No change in bowel habits, no abdominal pain. Postmenopausal - no HRT, no bleeding. Normal pap and mammogram history. T2DM managed by endocrinology. Becky Decker present during visit.   Gynecologic History Patient's last menstrual period was 03/13/2012.   Contraception/Family planning: post menopausal status  Health Maintenance Last Pap: 02/17/2017. Results were: normal Last mammogram: 02/15/2019. Results were: normal Last colonoscopy: 2017. Results were: polyps Last Dexa: 03/2019. Results were: normal  Past medical history, past surgical history, family history and social history were all reviewed and documented in the EPIC chart. Married. 1 daughter and 1 son.   ROS:  A ROS was performed and pertinent positives and negatives are included.  Exam:  Vitals:   01/02/21 0912  BP: 124/82  Weight: 199 lb (90.3 kg)  Height: 5\' 4"  (1.626 m)   Body mass index is 34.16 kg/m.  General appearance:  Normal Thyroid:  Symmetrical, normal in size, without palpable masses or nodularity. Respiratory  Auscultation:  Clear without wheezing or rhonchi Cardiovascular  Auscultation:  Regular rate, without rubs, murmurs or gallops  Edema/varicosities:  Not grossly evident Abdominal  Soft,nontender, without masses, guarding or rebound.  Liver/spleen:  No organomegaly noted  Hernia:  None appreciated  Skin  Inspection:  approx 1 cm brown, scaly, raised lesion on lower right abdomen with flat surrounding border. Could be seborrheic keratosis? She says it has increased in size Breasts: Examined lying and sitting.   Right: Without masses, retractions, nipple discharge or axillary adenopathy.   Left: Without masses, retractions, nipple discharge or axillary  adenopathy. Gentitourinary   Inguinal/mons:  Normal without inguinal adenopathy  External genitalia:  Normal appearing vulva with no masses, tenderness, or lesions  BUS/Urethra/Skene's glands:  Normal  Vagina:  Normal appearing with normal color and discharge, no lesions. Atrophic changes.   Cervix:  Normal appearing without discharge or lesions  Uterus:  Normal in size, shape and contour.  Midline and mobile, nontender  Adnexa/parametria:     Rt: Normal in size, without masses or tenderness.   Lt: Normal in size, without masses or tenderness.  Anus and perineum: Normal  Digital rectal exam: Normal sphincter tone without palpated masses or tenderness  Assessment/Plan:  61 y.o. [name] for annual exam.   Well female exam with routine gynecological exam - Education provided on SBEs, importance of preventative screenings, current guidelines, high calcium diet, regular exercise, and multivitamin daily. Labs with endocrinology.   Chronic left-sided low back pain without sciatica - She complains of left lower back pain that started last fall. The pain is intermittent, crampy, and hurts when taking a breath. It will last for days and then not return for days to weeks. Likely musculoskeletal in nature. She has gained 12 pounds in 1 year and could be related to pain. Recommend monitoring symptoms and see if it relates to activity, rest, bowels etc. Work on weight loss. If this continues it is recommended she establish with PCP for evaluation and management.  Skin lesion - approx 1 cm brown, scaly, raised lesion on lower right abdomen with flat surrounding border. Could be seborrheic keratosis? She says it has increased in size recently. Recommended she follow up with dermatology for evaluation due to evolution.   Screening for cervical cancer - Normal Pap history.  Will repeat at 5-year  interval per guidelines.  Screening for breast cancer - Normal mammogram history.  She is overdue for mammogram and  plans to schedule this soon.   Normal breast exam today.  Screening for colon cancer - 2017 colonoscopy. Will repeat at GI's recommended interval.   Screening for osteoporosis - normal Dexa 03/2019. Will repeat at age 31.   Return in 1 year for annual.    Tamela Gammon DNP, 9:38 AM 01/02/2021

## 2021-01-02 NOTE — Patient Instructions (Addendum)
Becky Decker [phone] [address] [place], [place] [address]  Health Maintenance for Postmenopausal Women Menopause is a normal process in which your ability to get pregnant comes to an end. This process happens slowly over many months or years, usually between the ages of 3 and 37. Menopause is complete when you have missed your menstrual periods for 12 months. It is important to talk with your health care provider about some of the most common conditions that affect women after menopause (postmenopausal women). These include heart disease, cancer, and bone loss (osteoporosis). Adopting a healthy lifestyle and getting preventive care can help to promote your health and wellness. The actions you take can also lower your chances of developing some of these common conditions. What should I know about menopause? During menopause, you may get a number of symptoms, such as:  Hot flashes. These can be moderate or severe.  Night sweats.  Decrease in sex drive.  Mood swings.  Headaches.  Tiredness.  Irritability.  Memory problems.  Insomnia. Choosing to treat or not to treat these symptoms is a decision that you make with your health care provider. Do I need hormone replacement therapy?  Hormone replacement therapy is effective in treating symptoms that are caused by menopause, such as hot flashes and night sweats.  Hormone replacement carries certain risks, especially as you become older. If you are thinking about using estrogen or estrogen with progestin, discuss the benefits and risks with your health care provider. What is my risk for heart disease and stroke? The risk of heart disease, heart attack, and stroke increases as you age. One of the causes may be a change in the body's hormones during menopause. This can affect how your body uses dietary fats, triglycerides, and cholesterol. Heart attack and stroke are medical emergencies. There are many  things that you can do to help prevent heart disease and stroke. Watch your blood pressure  High blood pressure causes heart disease and increases the risk of stroke. This is more likely to develop in people who have high blood pressure readings, are of African descent, or are overweight.  Have your blood pressure checked: ? Every 3-5 years if you are 55-9 years of age. ? Every year if you are 97 years old or older. Eat a healthy diet  Eat a diet that includes plenty of vegetables, fruits, low-fat dairy products, and lean protein.  Do not eat a lot of foods that are high in solid fats, added sugars, or sodium.   Get regular exercise Get regular exercise. This is one of the most important things you can do for your health. Most adults should:  Try to exercise for at least 150 minutes each week. The exercise should increase your heart rate and make you sweat (moderate-intensity exercise).  Try to do strengthening exercises at least twice each week. Do these in addition to the moderate-intensity exercise.  Spend less time sitting. Even light physical activity can be beneficial. Other tips  Work with your health care provider to achieve or maintain a healthy weight.  Do not use any products that contain nicotine or tobacco, such as cigarettes, e-cigarettes, and chewing tobacco. If you need help quitting, ask your health care provider.  Know your numbers. Ask your health care provider to check your cholesterol and your blood sugar (glucose). Continue to have your blood tested as directed by your health care provider. Do I need screening for cancer? Depending on your health  history and family history, you may need to have cancer screening at different stages of your life. This may include screening for:  Breast cancer.  Cervical cancer.  Lung cancer.  Colorectal cancer. What is my risk for osteoporosis? After menopause, you may be at increased risk for osteoporosis. Osteoporosis is  a condition in which bone destruction happens more quickly than new bone creation. To help prevent osteoporosis or the bone fractures that can happen because of osteoporosis, you may take the following actions:  If you are 40-24 years old, get at least 1,000 mg of calcium and at least 600 mg of vitamin D per day.  If you are older than age 39 but younger than age 27, get at least 1,200 mg of calcium and at least 600 mg of vitamin D per day.  If you are older than age 88, get at least 1,200 mg of calcium and at least 800 mg of vitamin D per day. Smoking and drinking excessive alcohol increase the risk of osteoporosis. Eat foods that are rich in calcium and vitamin D, and do weight-bearing exercises several times each week as directed by your health care provider. How does menopause affect my mental health? Depression may occur at any age, but it is more common as you become older. Common symptoms of depression include:  Low or sad mood.  Changes in sleep patterns.  Changes in appetite or eating patterns.  Feeling an overall lack of motivation or enjoyment of activities that you previously enjoyed.  Frequent crying spells. Talk with your health care provider if you think that you are experiencing depression. General instructions See your health care provider for regular wellness exams and vaccines. This may include:  Scheduling regular health, dental, and eye exams.  Getting and maintaining your vaccines. These include: ? Influenza vaccine. Get this vaccine each year before the flu season begins. ? Pneumonia vaccine. ? Shingles vaccine. ? Tetanus, diphtheria, and pertussis (Tdap) booster vaccine. Your health care provider may also recommend other immunizations. Tell your health care provider if you have ever been abused or do not feel safe at home. Summary  Menopause is a normal process in which your ability to get pregnant comes to an end.  This condition causes hot flashes, night  sweats, decreased interest in sex, mood swings, headaches, or lack of sleep.  Treatment for this condition may include hormone replacement therapy.  Take actions to keep yourself healthy, including exercising regularly, eating a healthy diet, watching your weight, and checking your blood pressure and blood sugar levels.  Get screened for cancer and depression. Make sure that you are up to date with all your vaccines. This information is not intended to replace advice given to you by your health care provider. Make sure you discuss any questions you have with your health care provider. Document Revised: 08/26/2018 Document Reviewed: 08/26/2018 Elsevier Patient Education  2021 [employer].

## 2021-04-15 ENCOUNTER — Other Ambulatory Visit: Payer: Self-pay | Admitting: Internal Medicine

## 2021-04-22 ENCOUNTER — Encounter: Payer: Self-pay | Admitting: Internal Medicine

## 2021-08-22 ENCOUNTER — Other Ambulatory Visit: Payer: Self-pay | Admitting: Nurse Practitioner

## 2021-08-22 DIAGNOSIS — Z1231 Encounter for screening mammogram for malignant neoplasm of breast: Secondary | ICD-10-CM

## 2021-09-04 ENCOUNTER — Ambulatory Visit
Admission: RE | Admit: 2021-09-04 | Discharge: 2021-09-04 | Disposition: A | Discharge status: Home / Self Care | Payer: BC Managed Care – PPO | Source: Ambulatory Visit | Attending: Nurse Practitioner | Admitting: Nurse Practitioner

## 2021-09-04 DIAGNOSIS — Z1231 Encounter for screening mammogram for malignant neoplasm of breast: Secondary | ICD-10-CM

## 2021-10-12 ENCOUNTER — Other Ambulatory Visit: Payer: Self-pay

## 2021-10-12 ENCOUNTER — Ambulatory Visit (AMBULATORY_SURGERY_CENTER): Payer: BC Managed Care – PPO

## 2021-10-12 VITALS — Ht 65.0 in | Wt 192.0 lb

## 2021-10-12 DIAGNOSIS — Z86010 Personal history of colonic polyps: Secondary | ICD-10-CM

## 2021-10-12 MED ORDER — NA SULFATE-K SULFATE-MG SULF 17.5-3.13-1.6 GM/177ML PO SOLN: 1.0000 | Freq: Once | ORAL | 0 refills | Status: AC

## 2021-10-12 NOTE — Progress Notes (Signed)
No egg or soy allergy known to patient  No issues known to pt with past sedation with any surgeries or procedures Patient denies ever being told they had issues or difficulty with intubation  No FH of Malignant Hyperthermia Pt is not on diet pills Pt is not on home 02  Pt is not on blood thinners  Pt reports issues with constipation=patient advised to increase po fluids/activity/fresh fruits/veggies; No A fib or A flutter Pt is fully vaccinated for Covid x 2; NO PA's for preps discussed with pt in PV today  Discussed with pt there will be an out-of-pocket cost for prep and that varies from $0 to 70 + dollars - pt verbalized understanding  Due to the COVID-19 pandemic we are asking patients to follow certain guidelines in PV and the [facility]   Pt aware of COVID protocols and [facility] guidelines  PV completed over the phone. Pt verified name, DOB, address and insurance during PV today.  Pt mailed instruction packet with copy of consent form to read and not return, and instructions.  Pt encouraged to call with questions or issues.  If pt has My chart, procedure instructions sent via My Chart

## 2021-10-23 ENCOUNTER — Encounter: Payer: Self-pay | Admitting: Internal Medicine

## 2021-10-26 ENCOUNTER — Encounter: Payer: Self-pay | Admitting: Internal Medicine

## 2021-10-26 ENCOUNTER — Other Ambulatory Visit: Payer: Self-pay

## 2021-10-26 ENCOUNTER — Ambulatory Visit (AMBULATORY_SURGERY_CENTER): Payer: BC Managed Care – PPO | Admitting: Internal Medicine

## 2021-10-26 VITALS — BP 118/85 | HR 82 | Temp 97.5°F | Resp 13 | Ht 65.0 in | Wt 192.0 lb

## 2021-10-26 DIAGNOSIS — D123 Benign neoplasm of transverse colon: Secondary | ICD-10-CM

## 2021-10-26 DIAGNOSIS — Z860100 Personal history of colon polyps, unspecified: Secondary | ICD-10-CM

## 2021-10-26 DIAGNOSIS — D122 Benign neoplasm of ascending colon: Secondary | ICD-10-CM

## 2021-10-26 MED ORDER — SODIUM CHLORIDE 0.9 % IV SOLN: 500.0000 mL | Freq: Once | INTRAVENOUS | Status: DC

## 2021-10-26 NOTE — Progress Notes (Signed)
Vitals-CW  Pt's states no medical or surgical changes since previsit or office visit. 

## 2021-10-26 NOTE — Progress Notes (Signed)
Sedate, gd SR, tolerated procedure well, VSS, report to RN 

## 2021-10-26 NOTE — Patient Instructions (Addendum)
Handouts were given to your care partner on polyps, diverticulosis, and Hemorrhoids. Your sugar was 154 in the recovery room. You may resume your current medications today. Await biopsy results.  May take 1-3 weeks to receive pathology results. Please call if any questions or concerns.     YOU HAD AN ENDOSCOPIC PROCEDURE TODAY AT [facility]:   Refer to the procedure report that was given to you for any specific questions about what was found during the examination.  If the procedure report does not answer your questions, please call your gastroenterologist to clarify.  If you requested that your care partner not be given the details of your procedure findings, then the procedure report has been included in a sealed envelope for you to review at your convenience later.  YOU SHOULD EXPECT: Some feelings of bloating in the abdomen. Passage of more gas than usual.  Walking can help get rid of the air that was put into your GI tract during the procedure and reduce the bloating. If you had a lower endoscopy (such as a colonoscopy or flexible sigmoidoscopy) you may notice spotting of blood in your stool or on the toilet paper. If you underwent a bowel prep for your procedure, you may not have a normal bowel movement for a few days.  Please Note:  You might notice some irritation and congestion in your nose or some drainage.  This is from the oxygen used during your procedure.  There is no need for concern and it should clear up in a day or so.  SYMPTOMS TO REPORT IMMEDIATELY:  Following lower endoscopy (colonoscopy or flexible sigmoidoscopy):  Excessive amounts of blood in the stool  Significant tenderness or worsening of abdominal pains  Swelling of the abdomen that is new, acute  Fever of 100F or higher   For urgent or emergent issues, a gastroenterologist can be reached at any hour by calling [phone]. Do not use MyChart messaging for urgent concerns.    DIET:  We do  recommend a small meal at first, but then you may proceed to your regular diet.  Drink plenty of fluids but you should avoid alcoholic beverages for 24 hours.  ACTIVITY:  You should plan to take it easy for the rest of today and you should NOT DRIVE or use heavy machinery until tomorrow (because of the sedation medicines used during the test).    FOLLOW UP: Our staff will call the number listed on your records 48-72 hours following your procedure to check on you and address any questions or concerns that you may have regarding the information given to you following your procedure. If we do not reach you, we will leave a message.  We will attempt to reach you two times.  During this call, we will ask if you have developed any symptoms of COVID 19. If you develop any symptoms (ie: fever, flu-like symptoms, shortness of breath, cough etc.) before then, please call [phone].  If you test positive for Covid 19 in the 2 weeks post procedure, please call and report this information to [place].    If any biopsies were taken you will be contacted by phone or by letter within the next 1-3 weeks.  Please call us at [phone] if you have not heard about the biopsies in 3 weeks.    SIGNATURES/CONFIDENTIALITY: You and/or your care partner have signed paperwork which will be entered into your electronic medical record.  These signatures attest to the fact that  that the information above on your After Visit Summary has been reviewed and is understood.  Full responsibility of the confidentiality of this discharge information lies with you and/or your care-partner.

## 2021-10-26 NOTE — Op Note (Signed)
[facility] Patient Name: Becky Decker Procedure Date: 10/26/2021 10:27 AM MRN: [id number] Endoscopist: Jerene Bears , MD Age: 61 Referring MD:  Date of Birth: 02/28/1961 Gender: Female Account #: [id number] Procedure:                Colonoscopy Indications:              High risk colon cancer surveillance: Personal                            history of non-advanced adenomas x2, Last                            colonoscopy: March 2017 Medicines:                Monitored Anesthesia Care Procedure:                Pre-Anesthesia Assessment:                           - Prior to the procedure, a History and Physical                            was performed, and patient medications and                            allergies were reviewed. The patient's tolerance of                            previous anesthesia was also reviewed. The risks                            and benefits of the procedure and the sedation                            options and risks were discussed with the patient.                            All questions were answered, and informed consent                            was obtained. Prior Anticoagulants: The patient has                            taken no previous anticoagulant or antiplatelet                            agents. ASA Grade Assessment: II - A patient with                            mild systemic disease. After reviewing the risks                            and benefits, the patient was deemed in  satisfactory condition to undergo the procedure.                           After obtaining informed consent, the colonoscope                            was passed under direct vision. Throughout the                            procedure, the patient's blood pressure, pulse, and                            oxygen saturations were monitored continuously. The                            PCF-HQ190L Colonoscope was introduced through  the                            anus and advanced to the cecum, identified by                            appendiceal orifice and ileocecal valve. The                            colonoscopy was performed without difficulty. The                            patient tolerated the procedure well. The quality                            of the bowel preparation was excellent. The                            ileocecal valve, appendiceal orifice, and rectum                            were photographed. Scope In: 10:42:03 AM Scope Out: 10:58:30 AM Scope Withdrawal Time: 0 hours 11 minutes 1 second  Total Procedure Duration: 0 hours 16 minutes 27 seconds  Findings:                 The digital rectal exam was normal.                           Three sessile polyps were found in the transverse                            colon (2) and ascending colon (1). The polyps were                            4 to 7 mm in size. These polyps were removed with a                            cold snare. Resection and retrieval were complete.  Multiple small and large-mouthed diverticula were                            found in the sigmoid colon and descending colon.                           Internal hemorrhoids were found during                            retroflexion. The hemorrhoids were small. Complications:            No immediate complications. Estimated Blood Loss:     Estimated blood loss was minimal. Impression:               - Three 4 to 7 mm polyps in the transverse colon                            (2) and in the ascending colon (1), removed with a                            cold snare. Resected and retrieved.                           - Diverticulosis in the sigmoid colon and in the                            descending colon.                           - Internal hemorrhoids. Recommendation:           - Patient has a contact number available for                            emergencies.  The signs and symptoms of potential                            delayed complications were discussed with the                            patient. Return to normal activities tomorrow.                            Written discharge instructions were provided to the                            patient.                           - Resume previous diet.                           - Continue present medications.                           - Await pathology results.                           -  Repeat colonoscopy is recommended for                            surveillance. The colonoscopy date will be                            determined after pathology results from today's                            exam become available for review. Jerene Bears, MD 10/26/2021 11:01:10 AM This report has been signed electronically.

## 2021-10-26 NOTE — Progress Notes (Signed)
No problems noted in the recovery room. maw 

## 2021-10-26 NOTE — Progress Notes (Signed)
GASTROENTEROLOGY PROCEDURE H&P NOTE   Primary Care Physician: Pcp, No    Reason for Procedure:  History of colon polyp  Plan:    Colonoscopy  Patient is appropriate for endoscopic procedure(s) in the ambulatory ([facility]) setting.  The nature of the procedure, as well as the risks, benefits, and alternatives were carefully and thoroughly reviewed with the patient. Ample time for discussion and questions allowed. The patient understood, was satisfied, and agreed to proceed.     HPI: Becky Decker is a 61 y.o. female who presents for surveillance colonoscopy.  Medical history as below.  Tolerated prep.  No recent chest pain or shortness of breath.  No abdominal pain today.    Past Medical History:  Diagnosis Date   Asthma    uses PRN inhaler - twice per year if that   Diabetes mellitus type II    on meds   OAB (overactive bladder)    Overweight(278.02)     Past Surgical History:  Procedure Laterality Date   CESAREAN SECTION     x 2   MOUTH SURGERY     ROTATOR CUFF REPAIR Left 2019   ROTATOR CUFF REPAIR Right 2021   TUBAL LIGATION  2001    Prior to Admission medications   Medication Sig Start Date End Date Taking? Authorizing Provider  glimepiride (AMARYL) 4 MG tablet TAKE 2 TABLETS BY MOUTH DAILY WITH BREAKFAST. 04/16/21  Yes Shamleffer, Melanie Crazier, MD  glucose blood (ONETOUCH VERIO) test strip Use Onetouch Verio test strips to check blood sugar once daily 05/19/20  Yes Shamleffer, Melanie Crazier, MD  metFORMIN (GLUCOPHAGE) 500 MG tablet TAKE 2 TABLETS BY MOUTH 2 TIMES DAILY WITH A MEAL. Patient taking differently: Take 1,000 mg by mouth daily with breakfast. 11/02/20  Yes Shamleffer, Melanie Crazier, MD  ibuprofen (ADVIL) 600 MG tablet TAKE 1 TABLET 4 TIME A DAY FOR 5 DAYS WITH FOOD AND THEN AS NEEDED 03/11/19   Huel Cote, NP    Current Outpatient Medications  Medication Sig Dispense Refill   glimepiride (AMARYL) 4 MG tablet TAKE 2 TABLETS BY  MOUTH DAILY WITH BREAKFAST. 180 tablet 1   glucose blood (ONETOUCH VERIO) test strip Use Onetouch Verio test strips to check blood sugar once daily 100 each 12   metFORMIN (GLUCOPHAGE) 500 MG tablet TAKE 2 TABLETS BY MOUTH 2 TIMES DAILY WITH A MEAL. (Patient taking differently: Take 1,000 mg by mouth daily with breakfast.) 360 tablet 0   ibuprofen (ADVIL) 600 MG tablet TAKE 1 TABLET 4 TIME A DAY FOR 5 DAYS WITH FOOD AND THEN AS NEEDED 60 tablet 0   Current Facility-Administered Medications  Medication Dose Route Frequency Provider Last Rate Last Admin   0.9 %  sodium chloride infusion  500 mL Intravenous Once Loui Massenburg, Lajuan Lines, MD        Allergies as of 10/26/2021 - Review Complete 10/26/2021  Allergen Reaction Noted   Milk-related compounds Other (See Comments) 11/17/2015    Family History  Problem Relation Age of Onset   Breast cancer Mother 74   Diabetes Father    Breast cancer Maternal Grandmother    Colon cancer Neg Hx    Colon polyps Neg Hx    Esophageal cancer Neg Hx    Rectal cancer Neg Hx    Stomach cancer Neg Hx     Social History   Socioeconomic History   Marital status: Married    Spouse name: Not on file   Number of children: Not on  file   Years of education: Not on file   Highest education level: Not on file  Occupational History   Not on file  Tobacco Use   Smoking status: Never   Smokeless tobacco: Never  Vaping Use   Vaping Use: Never used  Substance and Sexual Activity   Alcohol use: No    Alcohol/week: 0.0 standard drinks   Drug use: No   Sexual activity: Yes    Birth control/protection: Post-menopausal    Comment: BTSP  Other Topics Concern   Not on file  Social History Narrative   Not on file   Social Determinants of Health   Financial Resource Strain: Not on file  Food Insecurity: Not on file  Transportation Needs: Not on file  Physical Activity: Not on file  Stress: Not on file  Social Connections: Not on file  Intimate Partner  Violence: Not on file    Physical Exam: Vital signs in last 24 hours: @BP  (!) 145/79 (BP Location: Right Arm, Patient Position: Sitting, Cuff Size: Normal)    Pulse 95    Temp (!) 97.5 F (36.4 C) (Temporal)    Ht 5\' 5"  (1.651 m)    Wt 192 lb (87.1 kg)    LMP 03/13/2012    SpO2 98%    BMI 31.95 kg/m  GEN: NAD EYE: Sclerae anicteric ENT: MMM CV: Non-tachycardic Pulm: CTA b/l GI: Soft, NT/ND NEURO:  Alert & Oriented x 3   Zenovia Jarred, MD [facility]  10/26/2021 10:31 AM

## 2021-10-30 ENCOUNTER — Telehealth: Payer: Self-pay

## 2021-10-30 NOTE — Telephone Encounter (Signed)
°  Follow up Call-  Call back number 10/26/2021  Post procedure Call Back phone  # [phone]  Permission to leave phone message Yes  Some recent data might be hidden     Patient questions:  Do you have a fever, pain , or abdominal swelling? No. Pain Score  0 *  Have you tolerated food without any problems? Yes.    Have you been able to return to your normal activities? Yes.    Do you have any questions about your discharge instructions: Diet   No. Medications  No. Follow up visit  No.  Do you have questions or concerns about your Care? No.  Actions: * If pain score is 4 or above: No action needed, pain <4.

## 2021-11-01 ENCOUNTER — Encounter: Payer: Self-pay | Admitting: Internal Medicine

## 2022-05-28 ENCOUNTER — Other Ambulatory Visit: Payer: Self-pay | Admitting: Internal Medicine

## 2022-05-30 ENCOUNTER — Other Ambulatory Visit: Payer: Self-pay | Admitting: Internal Medicine

## 2022-05-31 ENCOUNTER — Telehealth: Payer: Self-pay | Admitting: Internal Medicine

## 2022-05-31 DIAGNOSIS — E1165 Type 2 diabetes mellitus with hyperglycemia: Secondary | ICD-10-CM

## 2022-05-31 MED ORDER — GLIMEPIRIDE 4 MG PO TABS: 8.0000 mg | ORAL_TABLET | Freq: Every day | ORAL | 3 refills | Status: DC

## 2022-05-31 NOTE — Telephone Encounter (Signed)
Rx sent to preferred pharmacy.

## 2022-05-31 NOTE — Telephone Encounter (Signed)
MEDICATION: Glimepiride   PHARMACY:  [facility] [address]  HAS THE PATIENT CONTACTED THEIR PHARMACY?  yes  IS THIS A 90 DAY SUPPLY : yes  IS PATIENT OUT OF MEDICATION: yes  IF NOT; HOW MUCH IS LEFT:   LAST APPOINTMENT DATE: '@09'$ -2021  NEXT APPOINTMENT DATE:'@1'$ /30/2024  DO WE HAVE YOUR PERMISSION TO LEAVE A DETAILED MESSAGE?:  OTHER COMMENTS:    **Let patient know to contact pharmacy at the end of the day to make sure medication is ready. **  ** Please notify patient to allow 48-72 hours to process**  **Encourage patient to contact the pharmacy for refills or they can request refills through [facility]**

## 2022-08-14 ENCOUNTER — Encounter: Payer: Self-pay | Admitting: Internal Medicine

## 2022-08-14 ENCOUNTER — Ambulatory Visit: Payer: BC Managed Care – PPO | Admitting: Internal Medicine

## 2022-08-14 VITALS — BP 124/80 | HR 91 | Ht 65.0 in | Wt 198.0 lb

## 2022-08-14 DIAGNOSIS — E1165 Type 2 diabetes mellitus with hyperglycemia: Secondary | ICD-10-CM | POA: Diagnosis not present

## 2022-08-14 LAB — BASIC METABOLIC PANEL
BUN: 11 mg/dL (ref 6–23)
CO2: 29 mEq/L (ref 19–32)
Calcium: 9.3 mg/dL (ref 8.4–10.5)
Chloride: 100 mEq/L (ref 96–112)
Creatinine, Ser: 0.64 mg/dL (ref 0.40–1.20)
GFR: 94.17 mL/min (ref >60.00)
Glucose, Bld: 231 mg/dL — ABNORMAL HIGH (ref 70–99)
Potassium: 3.9 mEq/L (ref 3.5–5.1)
Sodium: 135 mEq/L (ref 135–145)

## 2022-08-14 LAB — MICROALBUMIN / CREATININE URINE RATIO
Creatinine,U: 97.9 mg/dL
Microalb Creat Ratio: 26.6 mg/g (ref 0.0–30.0)
Microalb, Ur: 26.1 mg/dL — ABNORMAL HIGH (ref 0.0–1.9)

## 2022-08-14 LAB — POCT GLYCOSYLATED HEMOGLOBIN (HGB A1C): Hemoglobin A1C: 12.1 % — AB (ref 4.0–5.6)

## 2022-08-14 LAB — POCT GLUCOSE (DEVICE FOR HOME USE): Glucose Fasting, POC: 239 mg/dL — AB (ref 70–99)

## 2022-08-14 MED ORDER — GLIMEPIRIDE 4 MG PO TABS: 8.0000 mg | ORAL_TABLET | Freq: Every day | ORAL | 2 refills | Status: DC

## 2022-08-14 MED ORDER — EMPAGLIFLOZIN 10 MG PO TABS: 10.0000 mg | ORAL_TABLET | Freq: Every day | ORAL | 2 refills | Status: DC

## 2022-08-14 NOTE — Patient Instructions (Signed)
-   Continue  Glimepiride 4 mg, 2 tablets before Breakfast  - Start Jardiance 10 mg, 1 tablet daily   HOW TO TREAT LOW BLOOD SUGARS (Blood sugar LESS THAN 70 MG/DL) Please follow the RULE OF 15 for the treatment of hypoglycemia treatment (when your (blood sugars are less than 70 mg/dL)   STEP 1: Take 15 grams of carbohydrates when your blood sugar is low, which includes:  3-4 GLUCOSE TABS  OR 3-4 OZ OF JUICE OR REGULAR SODA OR ONE TUBE OF GLUCOSE GEL    STEP 2: RECHECK blood sugar in 15 MINUTES STEP 3: If your blood sugar is still low at the 15 minute recheck --> then, go back to STEP 1 and treat AGAIN with another 15 grams of carbohydrates.

## 2022-08-14 NOTE — Progress Notes (Signed)
Name: Becky Decker  Age/ Sex: 61 y.o., female   MRN/ DOB: [id number], [birth year]     PCP: Pcp, No   Reason for Endocrinology Evaluation: Type 2 Diabetes Mellitus  Initial Endocrine Consultative Visit: 01/20/2019    PATIENT IDENTIFIER: Becky Decker is a 61 y.o. female with a past medical history of T2Dm and Dyslipidemia. The patient has followed with Endocrinology clinic since 01/20/2019 for consultative assistance with management of her diabetes.  DIABETIC HISTORY:  Becky Decker was diagnosed with DM in 2013.Has been on Glimepiride since diagnosis. Was on Janumet in the past. Her hemoglobin A1c has ranged from 12.7 % in 2020, peaking at 13.8 in 2013.  On her initial visit to our clinic she had an A1c of 12.7% . She was on Glimepiride only. She declined insulin . We switched Glimepiride to Glipizide and added metformin but she self discontinued Glipizide.  SUBJECTIVE:   During the last visit (06/12/2020): A1c 7.7%      Today (08/14/2022): Becky Decker is here for a follow up on diabetes management. The pt has NOT been seen in 26 months.  She checks her blood sugars occasionally.  The patient did not bring a meter today. Denies hypoglycemia    She stopped taking Metformin due to fear of side effects that she read online  She continue with Glimepiride   Denies nausea, vomiting or diarrhea      HOME DIABETES REGIMEN:  Glimepiride 4 mg, 2 tabs daily  Metformin 500 mg XR , 2 tab BID    Statin: no   ACE-I/ARB: no   METER DOWNLOAD SUMMARY: Did not bring    DIABETIC COMPLICATIONS: Microvascular complications:   Denies: retinopathy, neuropathy, CKD Last Eye Exam: Completed yrs ago  Macrovascular complications:   Denies: CAD, CVA, PVD   HISTORY:  Past Medical History:  Past Medical History:  Diagnosis Date   Asthma    uses PRN inhaler - twice per year if that   Diabetes mellitus type II    on meds   OAB (overactive bladder)     Overweight(278.02)    Past Surgical History:  Past Surgical History:  Procedure Laterality Date   CESAREAN SECTION     x 2   MOUTH SURGERY     ROTATOR CUFF REPAIR Left 2019   ROTATOR CUFF REPAIR Right 2021   TUBAL LIGATION  2001   Social History:  reports that she has never smoked. She has never used smokeless tobacco. She reports that she does not drink alcohol and does not use drugs. Family History:  Family History  Problem Relation Age of Onset   Breast cancer Mother 36   Diabetes Father    Breast cancer Maternal Grandmother    Colon cancer Neg Hx    Colon polyps Neg Hx    Esophageal cancer Neg Hx    Rectal cancer Neg Hx    Stomach cancer Neg Hx      HOME MEDICATIONS: Allergies as of 08/14/2022       Reactions   Milk-related Compounds Other (See Comments)   Lactose intolerance with milk        Medication List        Accurate as of August 14, 2022  7:20 AM. If you have any questions, ask your nurse or doctor.          glimepiride 4 MG tablet Commonly known as: AMARYL Take 2 tablets (8 mg total) by mouth daily with breakfast.   ibuprofen 600 MG  tablet Commonly known as: ADVIL TAKE 1 TABLET 4 TIME A DAY FOR 5 DAYS WITH FOOD AND THEN AS NEEDED   metFORMIN 500 MG tablet Commonly known as: GLUCOPHAGE TAKE 2 TABLETS BY MOUTH 2 TIMES DAILY WITH A MEAL. What changed: See the new instructions.   OneTouch Verio test strip Generic drug: glucose blood Use Onetouch Verio test strips to check blood sugar once daily         OBJECTIVE:   Vital Signs: LMP 03/13/2012   Wt Readings from Last 3 Encounters:  10/26/21 192 lb (87.1 kg)  10/12/21 192 lb (87.1 kg)  01/02/21 199 lb (90.3 kg)     Exam: General: Pt appears well and is in NAD  Neck: General: Supple without adenopathy. Thyroid: Thyroid size normal.  No goiter or nodules appreciated.  Lungs: Clear with good BS bilat with no rales, rhonchi, or wheezes  Heart: RRR   Abdomen: soft, nontender,  without masses or organomegaly palpable  Extremities: No pretibial edema.   Neuro: MS is good with appropriate affect, pt is alert and Ox3      DATA REVIEWED:  Lab Results  Component Value Date   HGBA1C 7.7 (A) 06/12/2020   HGBA1C 11.0 (A) 04/12/2020   HGBA1C 12.7 (H) 12/29/2018    Latest Reference Range & Units 08/14/22 09:16  Sodium 135 - 145 mEq/L 135  Potassium 3.5 - 5.1 mEq/L 3.9  Chloride 96 - 112 mEq/L 100  CO2 19 - 32 mEq/L 29  Glucose 70 - 99 mg/dL 231 (H)  BUN 6 - 23 mg/dL 11  Creatinine 0.40 - 1.20 mg/dL 0.64  Calcium 8.4 - 10.5 mg/dL 9.3  GFR >60.00 mL/min 94.17    Latest Reference Range & Units 08/14/22 09:16  MICROALB/CREAT RATIO 0.0 - 30.0 mg/g 26.6      BG 239 mg/dL   ASSESSMENT / PLAN / RECOMMENDATIONS:   1) Type 2 Diabetes Mellitus, with improving glycemic control, Without complications - Most recent A1c of 12.1%. Goal A1c < 7.0 %.     - Poorly controlled diabetes due to medication nonadherent and dietary indiscretions  - We again discussed risk of blindness, ESRD and increased risk of amputations with uncontrolled DM  - She has not been here in over 2 yr  - Pt skeptical about medications in general, had self discontinued metformin due to reading negative online reviews but no side effects  - Discussed jardiance, will start as below  -GFR, MA/CR ratio normal  MEDICATIONS: Continue  Glimepiride 4 mg, 2 tabs before Breakfast  Start Jardiance 10 mg daily       EDUCATION / INSTRUCTIONS: BG monitoring instructions: Patient is instructed to check her blood sugars 1 times a day, fasting. Call [facility] if: BG persistently < 70  I reviewed the Rule of 15 for the treatment of hypoglycemia in detail with the patient. Literature supplied.    2) Diabetic complications:  Eye: Unknown to  have known diabetic retinopathy. Pt urged to have an eye exam  Neuro/ Feet: Does not have known diabetic peripheral neuropathy Renal: Patient  does not have known baseline CKD. She is not on an ACEI/ARB at present     3) Dyslipidemia: She had declines statin therapy . We had discussed cardiovascular benefits and her increased risk of strokes and heart attacks in the past     F/U in 6 months    Signed electronically by: Mack Guise, MD  [facility]  [facility] [address], [address] [place], [place] [address] Phone: [phone] FAX: [phone]   CC: Pcp, No No address on file Phone: None  Fax: None  Return to Endocrinology clinic as below: Future Appointments  Date Time Provider [place]  08/14/2022  8:50 AM Kaysey Berndt, Melanie Crazier, MD [facility] None

## 2022-08-15 ENCOUNTER — Encounter: Payer: Self-pay | Admitting: Internal Medicine

## 2022-10-15 ENCOUNTER — Ambulatory Visit: Payer: BC Managed Care – PPO | Admitting: Internal Medicine

## 2022-11-15 ENCOUNTER — Other Ambulatory Visit: Payer: Self-pay | Admitting: Nurse Practitioner

## 2022-11-15 DIAGNOSIS — Z1231 Encounter for screening mammogram for malignant neoplasm of breast: Secondary | ICD-10-CM

## 2022-11-20 ENCOUNTER — Ambulatory Visit
Admission: RE | Admit: 2022-11-20 | Discharge: 2022-11-20 | Disposition: A | Discharge status: Home / Self Care | Payer: BC Managed Care – PPO | Source: Ambulatory Visit | Attending: Nurse Practitioner | Admitting: Nurse Practitioner

## 2022-11-20 DIAGNOSIS — Z1231 Encounter for screening mammogram for malignant neoplasm of breast: Secondary | ICD-10-CM

## 2023-02-12 ENCOUNTER — Encounter: Payer: Self-pay | Admitting: Internal Medicine

## 2023-02-12 ENCOUNTER — Ambulatory Visit (INDEPENDENT_AMBULATORY_CARE_PROVIDER_SITE_OTHER): Payer: BC Managed Care – PPO | Admitting: Internal Medicine

## 2023-02-12 VITALS — BP 134/86 | HR 86 | Ht 65.0 in | Wt 182.0 lb

## 2023-02-12 DIAGNOSIS — E1165 Type 2 diabetes mellitus with hyperglycemia: Secondary | ICD-10-CM

## 2023-02-12 DIAGNOSIS — E785 Hyperlipidemia, unspecified: Secondary | ICD-10-CM

## 2023-02-12 DIAGNOSIS — E1142 Type 2 diabetes mellitus with diabetic polyneuropathy: Secondary | ICD-10-CM | POA: Diagnosis not present

## 2023-02-12 DIAGNOSIS — Z7984 Long term (current) use of oral hypoglycemic drugs: Secondary | ICD-10-CM | POA: Diagnosis not present

## 2023-02-12 DIAGNOSIS — Z5320 Procedure and treatment not carried out because of patient's decision for unspecified reasons: Secondary | ICD-10-CM

## 2023-02-12 LAB — BASIC METABOLIC PANEL WITH GFR
BUN: 17 mg/dL (ref 6–23)
CO2: 26 meq/L (ref 19–32)
Calcium: 9 mg/dL (ref 8.4–10.5)
Chloride: 105 meq/L (ref 96–112)
Creatinine, Ser: 0.76 mg/dL (ref 0.40–1.20)
GFR: 83.2 mL/min
Glucose, Bld: 84 mg/dL (ref 70–99)
Potassium: 3.7 meq/L (ref 3.5–5.1)
Sodium: 139 meq/L (ref 135–145)

## 2023-02-12 LAB — POCT GLYCOSYLATED HEMOGLOBIN (HGB A1C): Hemoglobin A1C: 8.4 % — AB (ref 4.0–5.6)

## 2023-02-12 LAB — LIPID PANEL
Cholesterol: 239 mg/dL — ABNORMAL HIGH (ref 0–200)
HDL: 54 mg/dL
LDL Cholesterol: 162 mg/dL — ABNORMAL HIGH (ref 0–99)
NonHDL: 185.07
Total CHOL/HDL Ratio: 4
Triglycerides: 115 mg/dL (ref 0.0–149.0)
VLDL: 23 mg/dL (ref 0.0–40.0)

## 2023-02-12 LAB — POCT GLUCOSE (DEVICE FOR HOME USE): Glucose Fasting, POC: 98 mg/dL (ref 70–99)

## 2023-02-12 MED ORDER — EMPAGLIFLOZIN 25 MG PO TABS: 25.0000 mg | ORAL_TABLET | Freq: Every day | ORAL | 3 refills | Status: AC

## 2023-02-12 MED ORDER — GLIMEPIRIDE 4 MG PO TABS: 8.0000 mg | ORAL_TABLET | Freq: Every day | ORAL | 2 refills | Status: DC

## 2023-02-12 NOTE — Progress Notes (Signed)
Name: Becky Decker  Age/ Sex: 62 y.o., female   MRN/ DOB: [id number], 1961/08/07     PCP: Pcp, No   Reason for Endocrinology Evaluation: Type 2 Diabetes Mellitus  Initial Endocrine Consultative Visit: 01/20/2019    PATIENT IDENTIFIER: [name] is a 61 y.o. female with a past medical history of T2Dm and Dyslipidemia. The patient has followed with Endocrinology clinic since 01/20/2019 for consultative assistance with management of her diabetes.  DIABETIC HISTORY:  [name] was diagnosed with DM in 2013.Has been on Glimepiride since diagnosis. Was on Janumet in the past. Her hemoglobin A1c has ranged from 12.7 % in 2020, peaking at 13.8 in 2013.  On her initial visit to our clinic she had an A1c of 12.7% . She was on Glimepiride only. She declined insulin . We switched Glimepiride to Glipizide and added metformin but she self discontinued Glipizide.   We restarted glimepiride, patient self discontinued metformin by 07/2022 after reading side effects   Started Jardiance 07/2022   SUBJECTIVE:   During the last visit (08/14/2022): A1c 12.1%      Today (02/12/2023): Ms. [name] is here for a follow up on diabetes management.  She checks her blood sugars occasionally.  The patient did not bring a meter today. Denies hypoglycemia   Daughter accepted to medical school   She has been following a low carb diet   Denies nausea, vomiting  Denies constipation or diarrhea  Denies UTI   She took Glimepiride today but did not eat breakfast ?  Has noted tingling of toes   HOME DIABETES REGIMEN:  Glimepiride 4 mg, 2 tabs daily  Jardiance 10 mg daily   Statin: no   ACE-I/ARB: no   METER DOWNLOAD SUMMARY: Did not bring    DIABETIC COMPLICATIONS: Microvascular complications:   Denies: retinopathy, neuropathy, CKD Last Eye Exam: Completed yrs ago  Macrovascular complications:   Denies: CAD, CVA, PVD   HISTORY:  Past Medical History:   Past Medical History:  Diagnosis Date   Asthma    uses PRN inhaler - twice per year if that   Diabetes mellitus type II    on meds   OAB (overactive bladder)    Overweight(278.02)    Past Surgical History:  Past Surgical History:  Procedure Laterality Date   CESAREAN SECTION     x 2   MOUTH SURGERY     ROTATOR CUFF REPAIR Left 2019   ROTATOR CUFF REPAIR Right 2021   TUBAL LIGATION  2001   Social History:  reports that she has never smoked. She has never used smokeless tobacco. She reports that she does not drink alcohol and does not use drugs. Family History:  Family History  Problem Relation Age of Onset   Breast cancer Mother 89   Diabetes Father    Breast cancer Maternal Grandmother    Colon cancer Neg Hx    Colon polyps Neg Hx    Esophageal cancer Neg Hx    Rectal cancer Neg Hx    Stomach cancer Neg Hx      HOME MEDICATIONS: Allergies as of 02/12/2023       Reactions   Milk-related Compounds Other (See Comments)   Lactose intolerance with milk        Medication List        Accurate as of Feb 12, 2023  9:09 AM. If you have any questions, ask your nurse or doctor.          empagliflozin  10 MG Tabs tablet Commonly known as: Jardiance Take 1 tablet (10 mg total) by mouth daily before breakfast.   glimepiride 4 MG tablet Commonly known as: AMARYL Take 2 tablets (8 mg total) by mouth daily with breakfast.   ibuprofen 600 MG tablet Commonly known as: ADVIL TAKE 1 TABLET 4 TIME A DAY FOR 5 DAYS WITH FOOD AND THEN AS NEEDED   meloxicam 15 MG tablet Commonly known as: MOBIC Take 15 mg by mouth daily.   OneTouch Verio test strip Generic drug: glucose blood Use Onetouch Verio test strips to check blood sugar once daily         OBJECTIVE:   Vital Signs: BP 134/86 (BP Location: Left Arm, Patient Position: Sitting, Cuff Size: Large)   Pulse 86   Ht 5\' 5"  (1.651 m)   Wt 182 lb (82.6 kg)   LMP 03/13/2012   SpO2 99%   BMI 30.29 kg/m   Wt  Readings from Last 3 Encounters:  02/12/23 182 lb (82.6 kg)  08/14/22 198 lb (89.8 kg)  10/26/21 192 lb (87.1 kg)     Exam: General: Pt appears well and is in NAD  Neck: General: Supple without adenopathy. Thyroid: Thyroid size normal.  No goiter or nodules appreciated.  Lungs: Clear with good BS bilat with no rales, rhonchi, or wheezes  Heart: RRR   Abdomen: soft, nontender, without masses or organomegaly palpable  Extremities: No pretibial edema.   Neuro: MS is good with appropriate affect, pt is alert and Ox3      DATA REVIEWED:  Lab Results  Component Value Date   HGBA1C 8.4 (A) 02/12/2023   HGBA1C 12.1 (A) 08/14/2022   HGBA1C 7.7 (A) 06/12/2020    Latest Reference Range & Units 02/12/23 09:25  Sodium 135 - 145 mEq/L 139  Potassium 3.5 - 5.1 mEq/L 3.7  Chloride 96 - 112 mEq/L 105  CO2 19 - 32 mEq/L 26  Glucose 70 - 99 mg/dL 84  BUN 6 - 23 mg/dL 17  Creatinine 1.61 - 0.96 mg/dL 0.45  Calcium 8.4 - 40.9 mg/dL 9.0  GFR >81.19 mL/min 83.20  Total CHOL/HDL Ratio  4  Cholesterol 0 - 200 mg/dL 147 (H)  HDL Cholesterol >39.00 mg/dL 82.95  LDL (calc) 0 - 99 mg/dL 621 (H)  NonHDL  308.65  Triglycerides 0.0 - 149.0 mg/dL 784.6  VLDL 0.0 - 96.2 mg/dL 95.2  (H): Data is abnormally high    Latest Reference Range & Units 08/14/22 09:16  MICROALB/CREAT RATIO 0.0 - 30.0 mg/g 26.6      BG 98 mg/dL   ASSESSMENT / PLAN / RECOMMENDATIONS:   1) Type 2 Diabetes Mellitus, with improving glycemic control, With Neuropathic complications - Most recent A1c of 8.4%. Goal A1c < 7.0 %.    -A1c has trended down from 12.1% to 8.4% -Historically she is very skeptical about medications, and side effects -She had self discontinued glipizide and metformin in the past -She has continued to take glimepiride and Jardiance -I will increase Jardiance as below -Encouraged the patient to continue with lifestyle changes -BMP normal on today's labs   MEDICATIONS: Continue  Glimepiride 4  mg, 2 tabs before Breakfast  Increase Jardiance 25 mg daily       EDUCATION / INSTRUCTIONS: BG monitoring instructions: Patient is instructed to check her blood sugars 1 times a day, fasting. Call [facility] if: BG persistently < 70  I reviewed the Rule of 15 for the treatment of hypoglycemia in detail with  the patient. Literature supplied.    2) Diabetic complications:  Eye: Unknown to  have known diabetic retinopathy. Pt urged to have an eye exam  Neuro/ Feet: Does not have known diabetic peripheral neuropathy Renal: Patient does not have known baseline CKD. She is not on an ACEI/ARB at present     3) Dyslipidemia:   -LDL remains above goal  -She continues to decline statin therapy  -We again discussed cardiovascular/stroke  benefits of statin therapy       F/U in 6 months    Signed electronically by: Lyndle Herrlich, MD  [facility]  [facility] [address] [place] [place], [place] [address] Phone: [phone] FAX: [phone]   CC: Pcp, No No address on file Phone: None  Fax: None  Return to Endocrinology clinic as below: No future appointments.

## 2023-02-12 NOTE — Patient Instructions (Addendum)
A1c 8.4% , down from 12.1 %  Goal  is less than 7.0%     - Continue  Glimepiride 4 mg, 2 tablets before Breakfast  - Increase Jardiance 25 mg, 1 tablet daily   HOW TO TREAT LOW BLOOD SUGARS (Blood sugar LESS THAN 70 MG/DL) Please follow the RULE OF 15 for the treatment of hypoglycemia treatment (when your (blood sugars are less than 70 mg/dL)   STEP 1: Take 15 grams of carbohydrates when your blood sugar is low, which includes:  3-4 GLUCOSE TABS  OR 3-4 OZ OF JUICE OR REGULAR SODA OR ONE TUBE OF GLUCOSE GEL    STEP 2: RECHECK blood sugar in 15 MINUTES STEP 3: If your blood sugar is still low at the 15 minute recheck --> then, go back to STEP 1 and treat AGAIN with another 15 grams of carbohydrates.

## 2023-05-14 ENCOUNTER — Other Ambulatory Visit: Payer: Self-pay | Admitting: Internal Medicine

## 2023-07-03 ENCOUNTER — Other Ambulatory Visit: Payer: Self-pay | Admitting: Internal Medicine

## 2023-08-12 IMAGING — MG MM DIGITAL SCREENING BILAT W/ TOMO AND CAD
6 of 10 series · 6 of 30 positions shown · non-contrast
Comparison: Previous exam(s).

CLINICAL DATA: Screening.

EXAM:
DIGITAL SCREENING BILATERAL MAMMOGRAM WITH TOMOSYNTHESIS AND CAD
TECHNIQUE: Bilateral screening digital craniocaudal and mediolateral oblique
mammograms were obtained. Bilateral screening digital breast
tomosynthesis was performed. The images were evaluated with
computer-aided detection.

[R MLO synth-2D (1 of 2)]
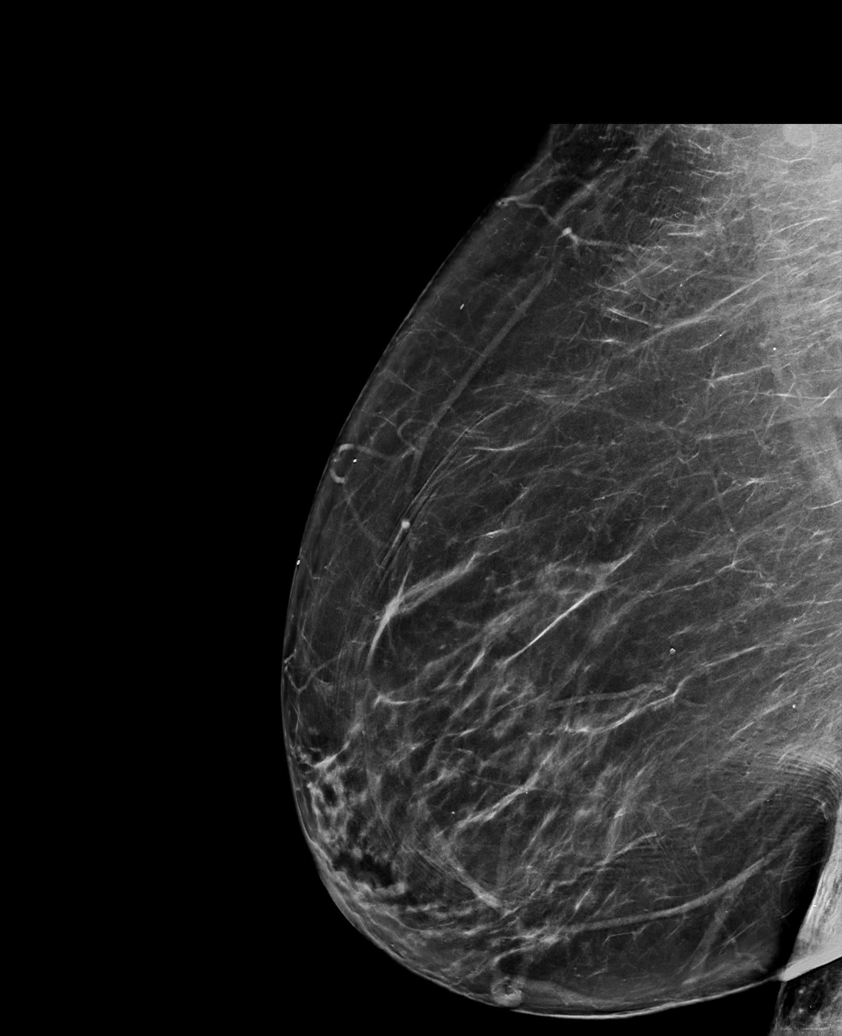

[L MLO synth-2D]
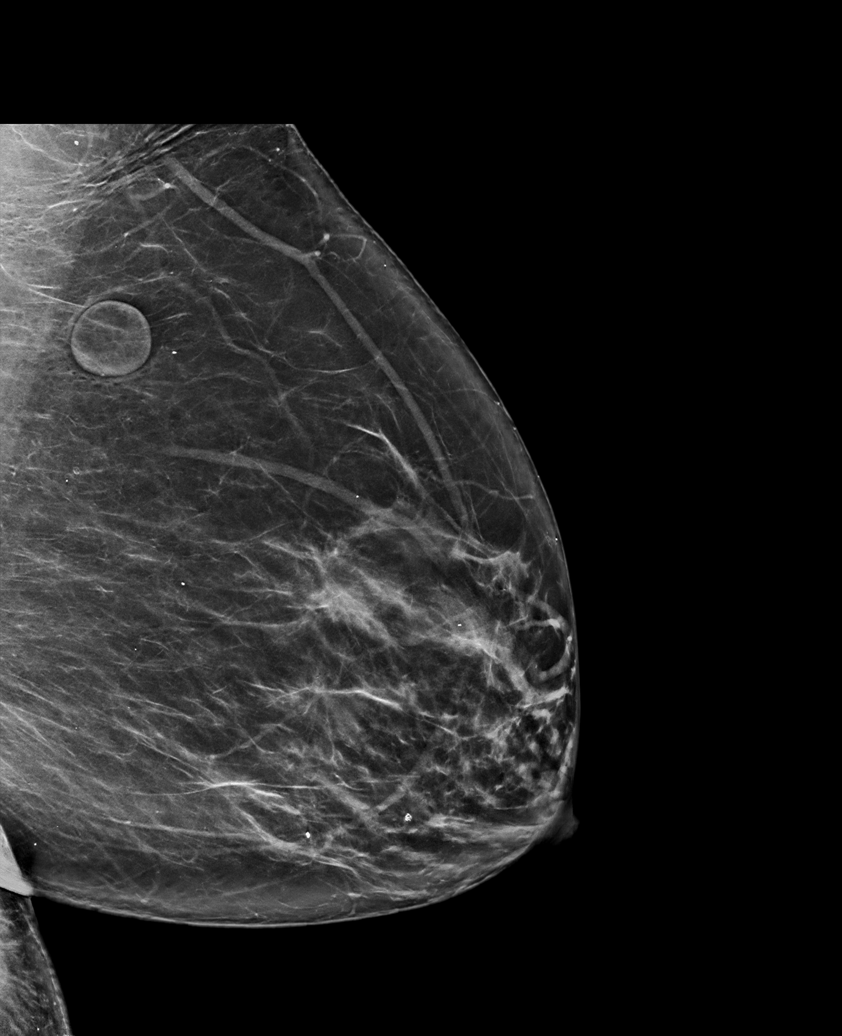

[L CC synth-2D]
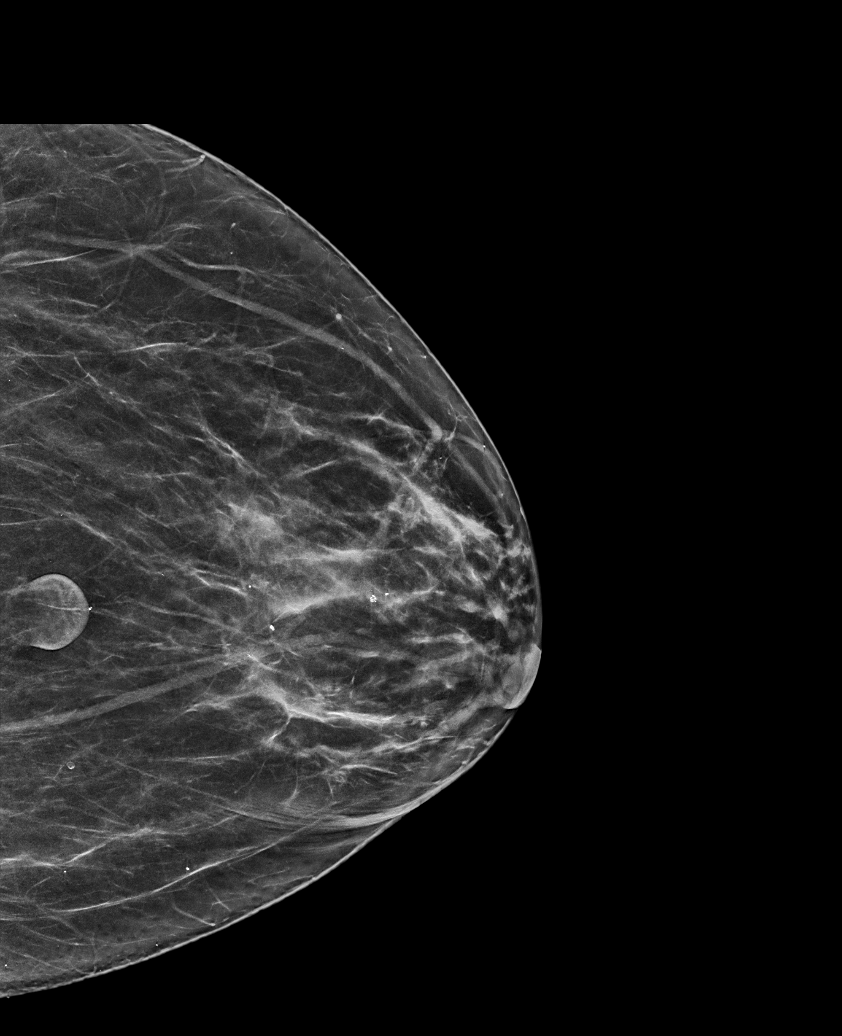

[R CC synth-2D]
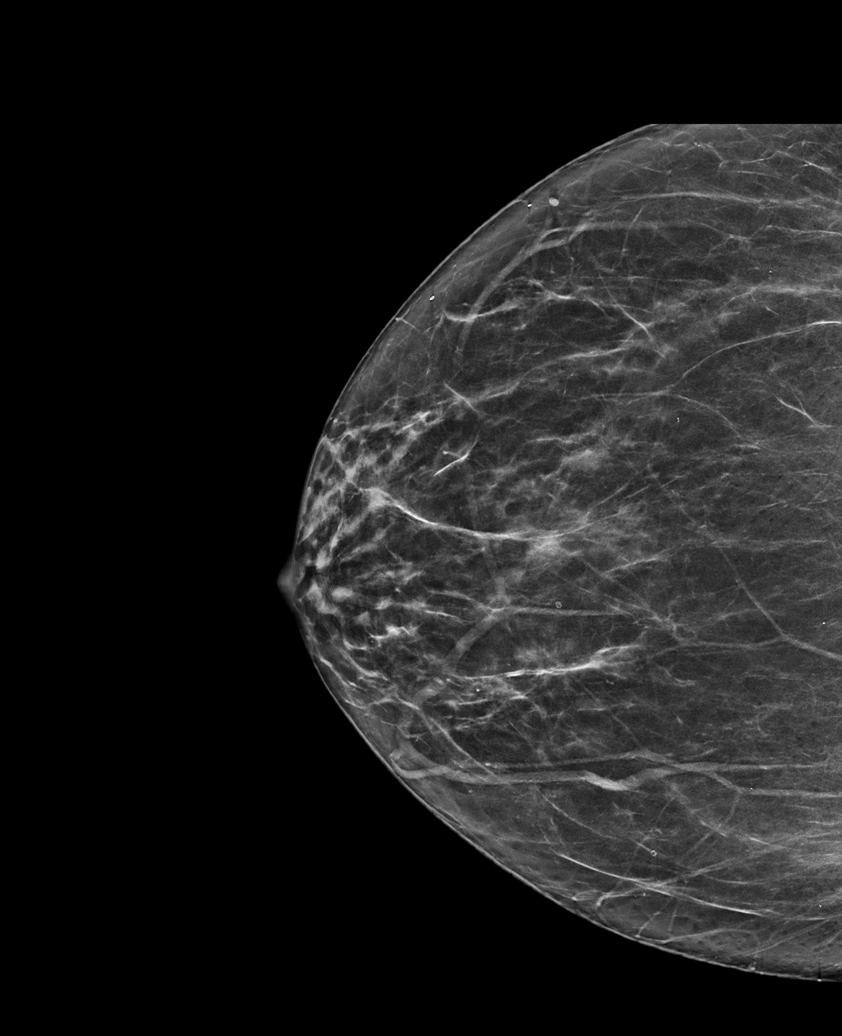

[R MLO synth-2D (2 of 2)]
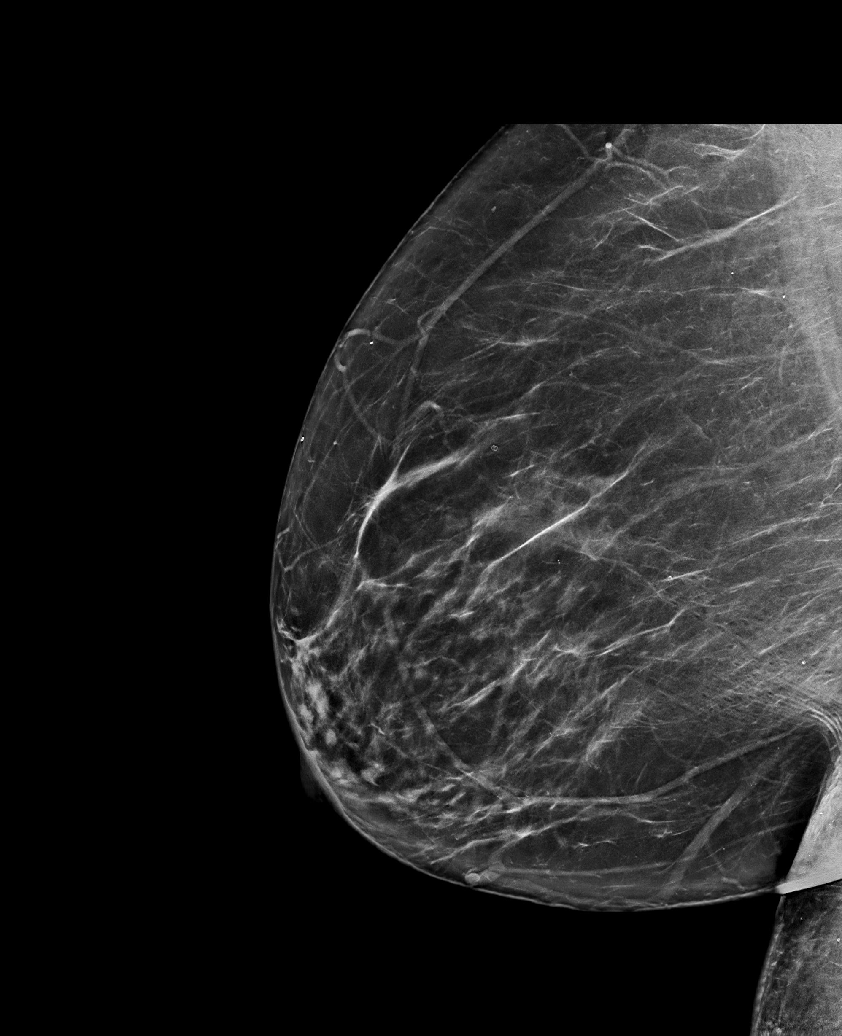

[R CC tomo · tomo slice 36/71.0]
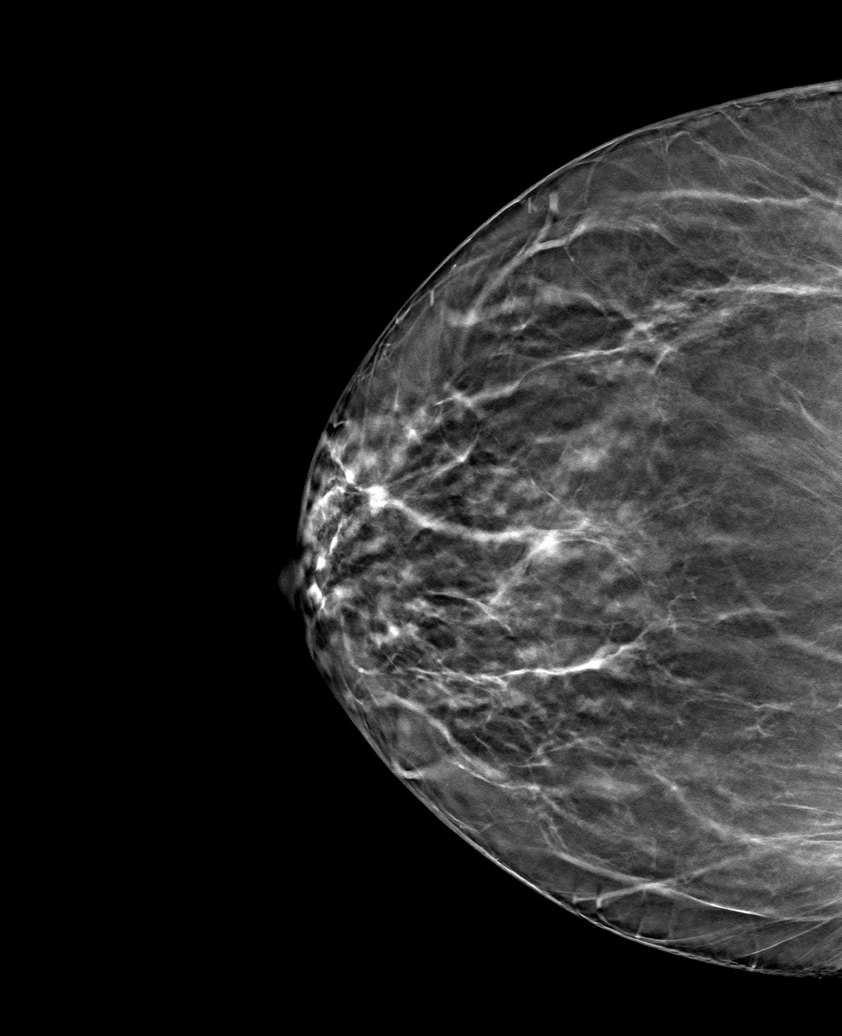

[6 of 30 positions shown; findings below may reference images not displayed]

ACR Breast Density Category b: There are scattered areas of
fibroglandular density.
FINDINGS: There are no findings suspicious for malignancy.
IMPRESSION: No mammographic evidence of malignancy. A result letter of this
screening mammogram will be mailed directly to the patient.

RECOMMENDATION:
Screening mammogram in one year. (Code:51-O-LD2)

BI-RADS CATEGORY  1: Negative.

## 2023-08-20 ENCOUNTER — Ambulatory Visit: Payer: BC Managed Care – PPO | Admitting: Internal Medicine

## 2023-08-20 NOTE — Progress Notes (Unsigned)
Name: Becky Decker  Age/ Sex: 62 y.o., female   MRN/ DOB: [id number], Dec 30, 1960     PCP: Pcp, No   Reason for Endocrinology Evaluation: Type 2 Diabetes Mellitus  Initial Endocrine Consultative Visit: 01/20/2019    PATIENT IDENTIFIER: [name] is a 62 y.o. female with a past medical history of T2Dm and Dyslipidemia. The patient has followed with Endocrinology clinic since 01/20/2019 for consultative assistance with management of her diabetes.  DIABETIC HISTORY:  [name] was diagnosed with DM in 2013.Has been on Glimepiride since diagnosis. Was on Janumet in the past. Her hemoglobin A1c has ranged from 12.7 % in 2020, peaking at 13.8 in 2013.  On her initial visit to our clinic she had an A1c of 12.7% . She was on Glimepiride only. She declined insulin . We switched Glimepiride to Glipizide and added metformin but she self discontinued Glipizide.   We restarted glimepiride, patient self discontinued metformin by 07/2022 after reading side effects   Started Jardiance 07/2022   SUBJECTIVE:   During the last visit (02/12/2023): A1c 8.4%      Today (08/20/2023): [name] is here for a follow up on diabetes management.  She checks her blood sugars occasionally.  The patient did not bring a meter today. Denies hypoglycemia   Daughter accepted to medical school   She has been following a low carb diet   Denies nausea, vomiting  Denies constipation or diarrhea  Denies UTI   She took Glimepiride today but did not eat breakfast ?  Has noted tingling of toes   HOME DIABETES REGIMEN:  Glimepiride 4 mg, 2 tabs daily  Jardiance 25 mg daily   Statin: no   ACE-I/ARB: no   METER DOWNLOAD SUMMARY: Did not bring    DIABETIC COMPLICATIONS: Microvascular complications:   Denies: retinopathy, neuropathy, CKD Last Eye Exam: Completed yrs ago  Macrovascular complications:   Denies: CAD, CVA, PVD   HISTORY:  Past Medical History:  Past  Medical History:  Diagnosis Date   Asthma    uses PRN inhaler - twice per year if that   Diabetes mellitus type II    on meds   OAB (overactive bladder)    Overweight(278.02)    Past Surgical History:  Past Surgical History:  Procedure Laterality Date   CESAREAN SECTION     x 2   MOUTH SURGERY     ROTATOR CUFF REPAIR Left 2019   ROTATOR CUFF REPAIR Right 2021   TUBAL LIGATION  2001   Social History:  reports that she has never smoked. She has never used smokeless tobacco. She reports that she does not drink alcohol and does not use drugs. Family History:  Family History  Problem Relation Age of Onset   Breast cancer Mother 61   Diabetes Father    Breast cancer Maternal Grandmother    Colon cancer Neg Hx    Colon polyps Neg Hx    Esophageal cancer Neg Hx    Rectal cancer Neg Hx    Stomach cancer Neg Hx      HOME MEDICATIONS: Allergies as of 08/20/2023       Reactions   Milk-related Compounds Other (See Comments)   Lactose intolerance with milk        Medication List        Accurate as of August 20, 2023  7:23 AM. If you have any questions, ask your nurse or doctor.          empagliflozin  25 MG Tabs tablet Commonly known as: Jardiance Take 1 tablet (25 mg total) by mouth daily before breakfast.   Jardiance 10 MG Tabs tablet Generic drug: empagliflozin TAKE 1 TABLET BY MOUTH DAILY BEFORE BREAKFAST.   glimepiride 4 MG tablet Commonly known as: AMARYL Take 2 tablets (8 mg total) by mouth daily with breakfast.   ibuprofen 600 MG tablet Commonly known as: ADVIL TAKE 1 TABLET 4 TIME A DAY FOR 5 DAYS WITH FOOD AND THEN AS NEEDED   meloxicam 15 MG tablet Commonly known as: MOBIC Take 15 mg by mouth daily.   OneTouch Verio test strip Generic drug: glucose blood Use Onetouch Verio test strips to check blood sugar once daily         OBJECTIVE:   Vital Signs: LMP 03/13/2012   Wt Readings from Last 3 Encounters:  02/12/23 182 lb (82.6 kg)   08/14/22 198 lb (89.8 kg)  10/26/21 192 lb (87.1 kg)     Exam: General: Pt appears well and is in NAD  Neck: General: Supple without adenopathy. Thyroid: Thyroid size normal.  No goiter or nodules appreciated.  Lungs: Clear with good BS bilat with no rales, rhonchi, or wheezes  Heart: RRR   Abdomen: soft, nontender, without masses or organomegaly palpable  Extremities: No pretibial edema.   Neuro: MS is good with appropriate affect, pt is alert and Ox3      DATA REVIEWED:  Lab Results  Component Value Date   HGBA1C 8.4 (A) 02/12/2023   HGBA1C 12.1 (A) 08/14/2022   HGBA1C 7.7 (A) 06/12/2020    Latest Reference Range & Units 02/12/23 09:25  Sodium 135 - 145 mEq/L 139  Potassium 3.5 - 5.1 mEq/L 3.7  Chloride 96 - 112 mEq/L 105  CO2 19 - 32 mEq/L 26  Glucose 70 - 99 mg/dL 84  BUN 6 - 23 mg/dL 17  Creatinine 5.62 - 1.30 mg/dL 8.65  Calcium 8.4 - 78.4 mg/dL 9.0  GFR >69.62 mL/min 83.20  Total CHOL/HDL Ratio  4  Cholesterol 0 - 200 mg/dL 952 (H)  HDL Cholesterol >39.00 mg/dL 84.13  LDL (calc) 0 - 99 mg/dL 244 (H)  NonHDL  010.27  Triglycerides 0.0 - 149.0 mg/dL 253.6  VLDL 0.0 - 64.4 mg/dL 03.4  (H): Data is abnormally high    Latest Reference Range & Units 08/14/22 09:16  MICROALB/CREAT RATIO 0.0 - 30.0 mg/g 26.6      BG 98 mg/dL   ASSESSMENT / PLAN / RECOMMENDATIONS:   1) Type 2 Diabetes Mellitus, with improving glycemic control, With Neuropathic complications - Most recent A1c of 8.4%. Goal A1c < 7.0 %.    -A1c has trended down from 12.1% to 8.4% -Historically she is very skeptical about medications, and side effects -She had self discontinued glipizide and metformin in the past -She has continued to take glimepiride and Jardiance -I will increase Jardiance as below -Encouraged the patient to continue with lifestyle changes -BMP normal on today's labs   MEDICATIONS: Continue  Glimepiride 4 mg, 2 tabs before Breakfast  Increase Jardiance 25 mg daily        EDUCATION / INSTRUCTIONS: BG monitoring instructions: Patient is instructed to check her blood sugars 1 times a day, fasting. Call [facility] if: BG persistently < 70  I reviewed the Rule of 15 for the treatment of hypoglycemia in detail with the patient. Literature supplied.    2) Diabetic complications:  Eye: Unknown to  have known diabetic retinopathy. Pt urged to have  an eye exam  Neuro/ Feet: Does not have known diabetic peripheral neuropathy Renal: Patient does not have known baseline CKD. She is not on an ACEI/ARB at present     3) Dyslipidemia:   -LDL remains above goal  -She continues to decline statin therapy  -We again discussed cardiovascular/stroke  benefits of statin therapy       F/U in 6 months    Signed electronically by: Lyndle Herrlich, MD  [facility]  [facility] [address] [place] [place], [place] [address] Phone: [phone] FAX: [phone]   CC: Pcp, No No address on file Phone: None  Fax: None  Return to Endocrinology clinic as below: Future Appointments  Date Time Provider Department Center  08/20/2023 10:50 AM Abdulahi Schor, Konrad Dolores, MD [facility] None

## 2024-01-06 ENCOUNTER — Telehealth: Payer: Self-pay

## 2024-01-06 NOTE — Telephone Encounter (Signed)
 Left vm for patient to callback regarding lab error.

## 2024-02-27 ENCOUNTER — Other Ambulatory Visit: Payer: Self-pay | Admitting: Obstetrics & Gynecology

## 2024-02-27 DIAGNOSIS — Z1231 Encounter for screening mammogram for malignant neoplasm of breast: Secondary | ICD-10-CM

## 2024-03-09 ENCOUNTER — Ambulatory Visit
Admission: RE | Admit: 2024-03-09 | Discharge: 2024-03-09 | Disposition: A | Discharge status: Home / Self Care | Payer: Self-pay | Source: Ambulatory Visit | Attending: Obstetrics & Gynecology

## 2024-03-09 DIAGNOSIS — Z1231 Encounter for screening mammogram for malignant neoplasm of breast: Secondary | ICD-10-CM

## 2024-03-20 ENCOUNTER — Other Ambulatory Visit: Payer: Self-pay | Admitting: Internal Medicine

## 2024-03-20 DIAGNOSIS — E1165 Type 2 diabetes mellitus with hyperglycemia: Secondary | ICD-10-CM

## 2024-03-22 ENCOUNTER — Telehealth: Payer: Self-pay

## 2024-03-22 NOTE — Telephone Encounter (Signed)
Contact patient to schedule follow up

## 2024-08-31 LAB — OPHTHALMOLOGY REPORT-SCANNED
